# Patient Record
Sex: Male | Born: 1990 | Race: Black or African American | Hispanic: No | Marital: Single | Smoking: Current every day smoker
Health system: Southern US, Community
[De-identification: ages and names within clinical notes are randomized; demographics above are authoritative.]

## PROBLEM LIST (undated history)

## (undated) ENCOUNTER — Emergency Department: Disposition: A | Discharge status: Home / Self Care | Payer: Self-pay

## (undated) DIAGNOSIS — D849 Immunodeficiency, unspecified: Secondary | ICD-10-CM

## (undated) DIAGNOSIS — A6920 Lyme disease, unspecified: Secondary | ICD-10-CM

---

## 2013-07-26 ENCOUNTER — Emergency Department: Payer: Self-pay | Admitting: Emergency Medicine

## 2014-09-07 ENCOUNTER — Emergency Department: Payer: Self-pay | Admitting: Student

## 2014-09-12 ENCOUNTER — Emergency Department (HOSPITAL_COMMUNITY): Payer: Self-pay

## 2014-09-12 ENCOUNTER — Emergency Department (HOSPITAL_COMMUNITY)
Admission: EM | Admit: 2014-09-12 | Discharge: 2014-09-12 | Disposition: A | Discharge status: Home / Self Care | Payer: Self-pay | Attending: Emergency Medicine | Admitting: Emergency Medicine

## 2014-09-12 ENCOUNTER — Encounter (HOSPITAL_COMMUNITY): Payer: Self-pay | Admitting: Emergency Medicine

## 2014-09-12 DIAGNOSIS — M25512 Pain in left shoulder: Secondary | ICD-10-CM

## 2014-09-12 DIAGNOSIS — Y9269 Other specified industrial and construction area as the place of occurrence of the external cause: Secondary | ICD-10-CM | POA: Insufficient documentation

## 2014-09-12 DIAGNOSIS — W109XXA Fall (on) (from) unspecified stairs and steps, initial encounter: Secondary | ICD-10-CM | POA: Insufficient documentation

## 2014-09-12 DIAGNOSIS — Y990 Civilian activity done for income or pay: Secondary | ICD-10-CM | POA: Insufficient documentation

## 2014-09-12 DIAGNOSIS — Y9389 Activity, other specified: Secondary | ICD-10-CM | POA: Insufficient documentation

## 2014-09-12 DIAGNOSIS — Z720 Tobacco use: Secondary | ICD-10-CM | POA: Insufficient documentation

## 2014-09-12 DIAGNOSIS — S4992XA Unspecified injury of left shoulder and upper arm, initial encounter: Secondary | ICD-10-CM | POA: Insufficient documentation

## 2014-09-12 MED ORDER — OXYCODONE-ACETAMINOPHEN 5-325 MG PO TABS: 1.0000 | ORAL_TABLET | Freq: Four times a day (QID) | ORAL | Status: DC | PRN

## 2014-09-12 MED ORDER — ONDANSETRON 4 MG PO TBDP
4.0000 mg | ORAL_TABLET | Freq: Once | ORAL | Status: AC
  Administered 2014-09-12: 4 mg via ORAL
  Filled 2014-09-12: qty 1

## 2014-09-12 MED ORDER — OXYCODONE-ACETAMINOPHEN 5-325 MG PO TABS
1.0000 | ORAL_TABLET | Freq: Once | ORAL | Status: AC
  Administered 2014-09-12: 1 via ORAL
  Filled 2014-09-12: qty 1

## 2014-09-12 NOTE — Progress Notes (Signed)
  CARE MANAGEMENT ED NOTE 09/12/2014  Patient:  Hilliard ClarkWHITTED,Adilson E   Account Number:  [id number]  Date Initiated:  09/12/2014  Documentation initiated by:  Edd ArbourGIBBS,KIMBERLY  Subjective/Objective Assessment:   23 yr old self pay [place] pt L shoulder injury Friday. 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 Continues to have pain. Decreased ROM. CMS intact distally.     Subjective/Objective Assessment Detail:   Pt [place] [address] [place], no pcp and no insurance coverage     Action/Plan:   ED Cm spoke with pt Cm provided resources see note below   Action/Plan Detail:   Anticipated DC Date:  09/12/2014     Status Recommendation to Physician:   Result of Recommendation:    Other ED Services  Consult Working Plan    DC Planning Services  Other  Outpatient Services - Pt will follow up  PCP issues    Choice offered to / List presented to:            Status of service:  Completed, signed off  ED Comments:   ED Comments Detail:  CM discussed and provided written information for [place] self pay pcps, importance of pcp for f/u care, www.needymeds.org, discounted pharmacies and other resources such as affordable care act,  Pt voiced understanding and appreciation of resources provided  Phineas Realharles Drew [facility] Location: [place], [place]- [id number] Contact Phone: [phone] Services: Dental Care Services, Enabling Services, Obstetrical and Gynecological Care, Primary Medical Care Remarks: Year round Phineas Realharles Drew [address] Location: [place], [place] -- [phone] Contact Phone: [phone] Services: Child, Teen, Adult and Insurance claims handlerenior Healthcare, Pitney BowesWomen's Health, Wellness, Prenatal Care and [employer]Family Planning, Physical Exams (school, employment, sports, nursing home, etc.), Chronic Illness, Minor Trauma, Immunizations, Flu Shots, Stage managerLaboratory Services On-Site, [place] Remarks: Community Health, Urban Area, [facility], Year-Round,  Full-Time (open 52 hours per week)  [facility] Location: [place], [place]- [id number] Contact Phone: [phone] Services: Medical Services Remarks: Hours Of Operation: Tuesday: 4:15 PM to 8:00 PM, Thursday: 4:15 PM to 8:00 PM; Eye Clinic Hours: Wednesday 9:00AM - 12:00PM, Thursday 1:00PM - 5:00PM - See more at: http://freeclinicdirectory.org/[place].html#[email or web address]  [facility] Location: [place], [place]- [id number] Contact Phone: [phone] Services: Child, Teen, Adult and American [employer] PowerSenior Healthcare; Immunizations; Physical Exams (school, employment, sports, nursing home, etc.); Well Woman exams; Chronic Illness; Minor Illness; Minor Trauma; Flu Shots; The [employer] CompaniesLaboratory Services; Care Management Remarks: If you are uninsured, payment for services is based on a sliding-fee scale according to family income. Hours: Monday/Tuesday/Wednesday/Thursday/Friday 8:00am - 1:30pm; Evening Hours Only: Monday 3:30pm - 7:30pm, Thursday 3:30pm - 7:30pm [address] Location: [place], [place] -- [phone] Contact Phone: [phone] Services: Child, Teen, Adult and American Electric PowerSenior Healthcare, Pitney BowesWomen's Health, Wellness, Prenatal Care and [employer]Family Planning, Physical Exams (school, employment, sports, nursing home, etc.), Chronic Illness, Minor Trauma, Immunizations, Flu Shots, Stage managerLaboratory Services On-Site, [place] Remarks: [facility], Rural Area, [facility], [place]Full-Time (open 45 hours per week) [facility] Location: [place], [place]- [phone] Contact Phone: [phone] Services: Child, Teen, Adult and Insurance claims handlerenior Healthcare, Pitney BowesWomen's Health, Wellness, Prenatal Care and [employer], IncFamily Planning, Physical Exams (school, employment, sports, nursing home, etc.), Chronic Illness, Minor Trauma, Immunizations, Flu Shots, United Technologies CorporationLaboratory Services On-Site, [place] Remarks: Year round, Rural Area, [facility], Year-Round, Full-Time (open 45 hours per week) - See more at:  http://[email or web address].org/[email or web address]_care/[place].html#[email or web address]

## 2014-09-12 NOTE — ED Notes (Signed)
Harrison, MD at bedside. 

## 2014-09-12 NOTE — ED Provider Notes (Signed)
CSN: [id number]     Arrival date & time 09/12/14  1517 History   First MD Initiated Contact with Patient 09/12/14 1542     Chief Complaint  Patient presents with  . Shoulder Pain     (Consider location/radiation/quality/duration/timing/severity/associated sxs/prior Treatment) Patient is a 23 y.o. male presenting with shoulder pain. The history is provided by the patient.  Shoulder Pain This is a new problem. Episode onset: 5 days ago. The problem occurs constantly. The problem has not changed since onset.Pertinent negatives include no chest pain, no abdominal pain, no headaches and no shortness of breath. Exacerbated by: moving the left shoulder. Nothing relieves the symptoms. He has tried rest for the symptoms. The treatment provided no relief.    History reviewed. No pertinent past medical history. History reviewed. No pertinent past surgical history. No family history on file. History  Substance Use Topics  . Smoking status: Current Every Day Smoker  . Smokeless tobacco: Not on file  . Alcohol Use: No    Review of Systems  Constitutional: Negative for fever.  HENT: Negative for drooling and rhinorrhea.   Eyes: Negative for pain.  Respiratory: Negative for cough and shortness of breath.   Cardiovascular: Negative for chest pain and leg swelling.  Gastrointestinal: Negative for nausea, vomiting, abdominal pain and diarrhea.  Genitourinary: Negative for dysuria and hematuria.  Musculoskeletal: Negative for gait problem and neck pain.  Skin: Negative for color change.  Neurological: Negative for numbness and headaches.  Hematological: Negative for adenopathy.  Psychiatric/Behavioral: Negative for behavioral problems.  All other systems reviewed and are negative.     Allergies  Review of patient's allergies indicates no known allergies.  Home Medications   Prior to Admission medications   Not on File   BP 126/81  Pulse 65  Temp(Src) 98.2 F (36.8 C) (Oral)  Resp  14  SpO2 100% Physical Exam  Nursing note and vitals reviewed. Constitutional: He is oriented to person, place, and time. He appears well-developed and well-nourished.  HENT:  Head: Normocephalic and atraumatic.  Right Ear: External ear normal.  Left Ear: External ear normal.  Nose: Nose normal.  Mouth/Throat: Oropharynx is clear and moist. No oropharyngeal exudate.  Eyes: Conjunctivae and EOM are normal. Pupils are equal, round, and reactive to light.  Neck: Normal range of motion. Neck supple.  No vertebral tenderness to palpation noted.  Cardiovascular: Normal rate, regular rhythm, normal heart sounds and intact distal pulses.  Exam reveals no gallop and no friction rub.   No murmur heard. Pulmonary/Chest: Effort normal and breath sounds normal. No respiratory distress. He has no wheezes.  Abdominal: Soft. Bowel sounds are normal. He exhibits no distension. There is no tenderness. There is no rebound and no guarding.  Musculoskeletal: He exhibits tenderness. He exhibits no edema.  Significantly decreased range of motion of the left shoulder due to pain. The patient appears to have normal strength in the left elbow and left wrist. Normal motor skills of the left hand. Normal sensation in the left upper terminate diffusely.  2+ distal and proximal pulses in the left upper extremity.  Diffuse tenderness to palpation of the left clavicle and left shoulder.  Neurological: He is alert and oriented to person, place, and time.  Skin: Skin is warm and dry.  Psychiatric: He has a normal mood and affect. His behavior is normal.    ED Course  Procedures (including critical care time) Labs Review Labs Reviewed - No data to display  Imaging Review Dg Clavicle  Left  09/12/2014   CLINICAL DATA:  Subsequent encounter for Fall down stairs 5 days ago landing on left shoulder. Severe pain.  EXAM: LEFT CLAVICLE - 2+ VIEWS  COMPARISON:  09/07/2014  FINDINGS: Two view exam the left clavicle again  shows the fracture involving the distal aspect of the left clavicle. The associated scapular fracture is re- demonstrated. Acromioclavicular and coracoclavicular distances are preserved despite the presence of the fractures.  IMPRESSION: Stable appearance of distal left clavicular and superior scapular fractures.   Electronically Signed   By: Kennith Center M.D.   On: 09/12/2014 16:13   Dg Shoulder Left  09/12/2014   CLINICAL DATA:  Fall down stairs on Friday. Severe pain to anterior shoulder and clavicle.  EXAM: LEFT SHOULDER - 2+ VIEW  COMPARISON:  None.  FINDINGS: Again identified is a mildly displaced, comminuted fracture involving the distal left clavicle. Nondisplaced scapular fracture is unchanged from previous exam. The glenohumeral joint appears intact. No humerus fracture.  IMPRESSION: 1. Stable appearance of distal clavicle and scapular fractures.   Electronically Signed   By: Signa Kell M.D.   On: 09/12/2014 16:09     EKG Interpretation None      MDM   Final diagnoses:  Left shoulder pain    4:11 PM 23 y.o. 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 He denies hitting his head or loss of consciousness. He has no vertebral tenderness to palpation. He has diffuse left shoulder pain which does not localize. Vital signs are unremarkable here. Will get pain control and imaging.  The patient notified me that he had a distal clavicle and fracture which was first identified after the fall at [facility]. His fractures appear unchanged and stable on today's imaging. Will place him in a sling for comfort and warned him about frozen shoulder. He has not had a PCP and he was given resources to followup in [place] where he lives. Will prescribe him more pain medicine.  5:11 PM:  I have discussed the diagnosis/risks/treatment options with the patient and  believe the pt to be eligible for discharge home to follow-up with a pcp in [place], use the resources provided. We also discussed returning to the ED immediately if new or worsening sx occur. We discussed the sx which are most concerning (e.g., worsening pain) that necessitate immediate return. Medications administered to the patient during their visit and any new prescriptions provided to the patient are listed below.  Medications given during this visit Medications  oxyCODONE-acetaminophen (PERCOCET/ROXICET) 5-325 MG per tablet 1 tablet (1 tablet Oral Given 09/12/14 1602)  ondansetron (ZOFRAN-ODT) disintegrating tablet 4 mg (4 mg Oral Given 09/12/14 1602)    New Prescriptions   OXYCODONE-ACETAMINOPHEN (PERCOCET) 5-325 MG PER TABLET    Take 1-2 tablets by mouth every 6 (six) hours as needed for moderate pain.     Purvis Sheffield, MD 09/12/14 1715

## 2014-09-12 NOTE — ED Notes (Signed)
Pt reports L shoulder injury Friday. 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 Continues to have pain. Decreased ROM. CMS intact distally.

## 2014-09-24 ENCOUNTER — Encounter (HOSPITAL_COMMUNITY): Payer: Self-pay | Admitting: Emergency Medicine

## 2014-09-24 ENCOUNTER — Emergency Department (HOSPITAL_COMMUNITY)
Admission: EM | Admit: 2014-09-24 | Discharge: 2014-09-24 | Disposition: A | Discharge status: Home / Self Care | Payer: Self-pay | Attending: Emergency Medicine | Admitting: Emergency Medicine

## 2014-09-24 ENCOUNTER — Emergency Department (HOSPITAL_COMMUNITY): Payer: Self-pay

## 2014-09-24 DIAGNOSIS — X58XXXA Exposure to other specified factors, initial encounter: Secondary | ICD-10-CM | POA: Insufficient documentation

## 2014-09-24 DIAGNOSIS — Z720 Tobacco use: Secondary | ICD-10-CM | POA: Insufficient documentation

## 2014-09-24 DIAGNOSIS — S42002D Fracture of unspecified part of left clavicle, subsequent encounter for fracture with routine healing: Secondary | ICD-10-CM

## 2014-09-24 DIAGNOSIS — S42192D Fracture of other part of scapula, left shoulder, subsequent encounter for fracture with routine healing: Secondary | ICD-10-CM | POA: Insufficient documentation

## 2014-09-24 DIAGNOSIS — S42102D Fracture of unspecified part of scapula, left shoulder, subsequent encounter for fracture with routine healing: Secondary | ICD-10-CM

## 2014-09-24 DIAGNOSIS — S42022D Displaced fracture of shaft of left clavicle, subsequent encounter for fracture with routine healing: Secondary | ICD-10-CM | POA: Insufficient documentation

## 2014-09-24 MED ORDER — HYDROCODONE-ACETAMINOPHEN 5-325 MG PO TABS
1.0000 | ORAL_TABLET | Freq: Four times a day (QID) | ORAL | Status: DC | PRN
Start: 2014-09-24 — End: 2015-09-02

## 2014-09-24 MED ORDER — NAPROXEN 500 MG PO TABS: 500.0000 mg | ORAL_TABLET | Freq: Two times a day (BID) | ORAL | Status: DC

## 2014-09-24 MED ORDER — OXYCODONE-ACETAMINOPHEN 5-325 MG PO TABS
1.0000 | ORAL_TABLET | Freq: Once | ORAL | Status: AC
Start: 2014-09-24 — End: 2014-09-24
  Administered 2014-09-24: 1 via ORAL
  Filled 2014-09-24: qty 1

## 2014-09-24 NOTE — Discharge Instructions (Signed)
Continue pain medications, ice. Please try to follow up with orthopedics or primary care doctor to make sure you are healing well and start physical therapy.   Clavicle Fracture The clavicle, also called the collarbone, is the long bone that connects your shoulder to your rib cage. You can feel your collarbone at the top of your shoulders and rib cage. A clavicle fracture is a broken clavicle. It is a common injury that can happen at any age.  CAUSES Common causes of a clavicle fracture include:  A direct blow to your shoulder.  A car accident.  A fall, especially if you try to break your fall with an outstretched arm. RISK FACTORS You may be at increased risk if:  You are younger than 25 years or older than 75 years. Most clavicle fractures happen to people who are younger than 25 years.  You are a male.  You play contact sports. SIGNS AND SYMPTOMS A fractured clavicle is painful. It also makes it hard to move your arm. Other signs and symptoms may include:  A shoulder that drops downward and forward.  Pain when trying to lift your shoulder.  Bruising, swelling, and tenderness over your clavicle.  A grinding noise when you try to move your shoulder.  A bump over your clavicle. DIAGNOSIS Your health care provider can usually diagnose a clavicle fracture by asking about your injury and examining your shoulder and clavicle. He or she may take an X-ray to determine the position of your clavicle. TREATMENT Treatment depends on the position of your clavicle after the fracture:  If the broken ends of the bone are not out of place, your health care provider may put your arm in a sling or wrap a support bandage around your chest (figure-of-eight wrap).  If the broken ends of the bone are out of place, you may need surgery. Surgery may involve placing screws, pins, or plates to keep your clavicle stable while it heals. Healing may take about 3 months. When your health care provider  thinks your fracture has healed enough, you may have to do physical therapy to regain normal movement and build up your arm strength. HOME CARE INSTRUCTIONS   Apply ice to the injured area:  Put ice in a plastic bag.  Place a towel between your skin and the bag.  Leave the ice on for 20 minutes, 2-3 times a day.  If you have a wrap or splint:  Wear it all the time, and remove it only to take a bath or shower.  When you bathe or shower, keep your shoulder in the same position as when the sling or wrap is on.  Do not lift your arm.  If you have a figure-of-eight wrap:  Another person must tighten it every day.  It should be tight enough to hold your shoulders back.  Allow enough room to place your index finger between your body and the strap.  Loosen the wrap immediately if you feel numbness or tingling in your hands.  Only take medicines as directed by your health care provider.  Avoid activities that make the injury or pain worse for 4-6 weeks after surgery.  Keep all follow-up appointments. SEEK MEDICAL CARE IF:  Your medicine is not helping to relieve pain and swelling. SEEK IMMEDIATE MEDICAL CARE IF:  Your arm is numb, cold, or pale, even when the splint is loose. MAKE SURE YOU:   Understand these instructions.  Will watch your condition.  Will get help right  away if you are not doing well or get worse. Document Released: 09/02/2005 Document Revised: 11/28/2013 Document Reviewed: 10/16/2013 [facility] Patient Information 2015 [place], [place]. This information is not intended to replace advice given to you by your health care provider. Make sure you discuss any questions you have with your health care provider.

## 2014-09-24 NOTE — ED Notes (Signed)
Pt reports he injured his left shoulder, was seen and treated in ED on 10/7, was given brace to wear and pain medication.pt reports he ran out of pain medication yesterday. Pain 8/10.

## 2014-09-24 NOTE — ED Notes (Signed)
Pt to xray

## 2014-09-24 NOTE — ED Provider Notes (Signed)
CSN: [id number]     Arrival date & time 09/24/14  1225 History  This chart was scribed for non-physician practitioner, Jaynie Crumbleatyana Victormanuel Mclure, PA-C working with Ethelda ChickMartha K Linker, MD by Greggory StallionKayla Andersen, ED scribe. This patient was seen in room WTR8/WTR8 and the patient's care was started at 1:08 PM.   Chief Complaint  Patient presents with  . Shoulder Pain   The history is provided by the patient. No language interpreter was used.   HPI Comments: Zachary Williamson is a 23 y.o. male who presents to the Emergency Department complaining of continued left shoulder pain after injury on 09/07/14. Pt was evaluated at [place] the day of the injury and states a collarbone fracture was found. He was evaluated here 5 days after initial injury and given a sling and percocet. He was also told to follow up with orthopedics but states he can not afford it since he doesn't have insurance. States he ran out of the percocet yesterday so he has taken tylenol and advil with no relief.   History reviewed. No pertinent past medical history. History reviewed. No pertinent past surgical history. History reviewed. No pertinent family history. History  Substance Use Topics  . Smoking status: Current Every Day Smoker  . Smokeless tobacco: Not on file  . Alcohol Use: No    Review of Systems  Musculoskeletal: Positive for arthralgias.  All other systems reviewed and are negative.  Allergies  Review of patient's allergies indicates no known allergies.  Home Medications   Prior to Admission medications   Medication Sig Start Date End Date Taking? Authorizing Provider  oxyCODONE-acetaminophen (PERCOCET) 5-325 MG per tablet Take 1-2 tablets by mouth every 6 (six) hours as needed for moderate pain. 09/12/14   Purvis SheffieldForrest Harrison, MD   BP 129/76  Pulse 103  Temp(Src) 98.2 F (36.8 C) (Oral)  Resp 16  SpO2 100%  Physical Exam  Nursing note and vitals reviewed. Constitutional: He is oriented to person, place, and time.  He appears well-developed and well-nourished. No distress.  HENT:  Head: Normocephalic and atraumatic.  Eyes: Conjunctivae and EOM are normal.  Neck: Neck supple. No tracheal deviation present.  Cardiovascular: Normal rate.   Pulmonary/Chest: Effort normal. No respiratory distress.  Musculoskeletal: Normal range of motion.  Tenderness over left clavicle and left scapula. There is no bruising, swelling, deformity over either one of them. Pain with any range of motion at the left shoulder. Unable to raise his arm past 90. Distal radial pulses intact. Normal elbow.  Neurological: He is alert and oriented to person, place, and time.  Skin: Skin is warm and dry.  Psychiatric: He has a normal mood and affect. His behavior is normal.    ED Course  Procedures (including critical care time)  DIAGNOSTIC STUDIES: Oxygen Saturation is 100% on RA, normal by my interpretation.    COORDINATION OF CARE: 1:10 PM-Discussed treatment plan which includes repeat xray with pt at bedside and pt agreed to plan.   Labs Review Labs Reviewed - No data to display  Imaging Review Dg Clavicle Left  09/24/2014   CLINICAL DATA:  Shoulder pain. Clavicle and scapular fractures. The pain has progressed. Subsequent encounter.  EXAM: LEFT CLAVICLE - 2+ VIEWS  COMPARISON:  Left shoulder in clavicle radiographs 09/12/2014.  FINDINGS: Continued callus formation is noted about the distal clavicle fracture. Scapular fractures are unchanged compared to the prior study. There is no new clavicle fracture. The visualized left hemithorax is clear.  IMPRESSION: 1. Progressive callus formation about  a distal left clavicle fracture. 2. Stable appearance of the left scapular fracture.   Electronically Signed   By: Gennette Pachris  Mattern M.D.   On: 09/24/2014 13:52   Dg Shoulder Left  09/24/2014   CLINICAL DATA:  Mildly displaced, comminuted, closed and posttraumatic fracture of distal left clavicle, subsequent for routine healing.  EXAM:  LEFT SHOULDER - 2+ VIEW  COMPARISON:  September 12, 2014.  FINDINGS: There remains a mildly displaced fracture involving the distal portion of the left clavicle. It appears to be comminuted. No definite callus formation is seen at this time. Glenohumeral joint appears normal. Ribs appear normal. No dislocation is noted.  IMPRESSION: Mildly displaced distal left clavicular fracture which is not significantly changed compared to prior exam.   Electronically Signed   By: Roque LiasJames  Green M.D.   On: 09/24/2014 13:52     EKG Interpretation None      MDM   Final diagnoses:  Clavicle fracture, left, with routine healing, subsequent encounter  Scapula fracture, left, with routine healing, subsequent encounter    Patient with known scapula and clavicle fracture after falling 2-1/2 weeks ago. [facility] and again here 2 weeks ago. He has not followed up with orthopedics. He is wearing a immobilizer brace. States pain is not improving. States he may have worsening pain in his clavicle. Images repeated today to make sure there is no worsening in his fracture. Patient emphasized importance of following up with orthopedic specialist to make sure he is healing appropriately. Patient voiced understanding. Will treat with Norco, naproxen. Followup.  Filed Vitals:   09/24/14 1241  BP: 129/76  Pulse: 103  Temp: 98.2 F (36.8 C)  TempSrc: Oral  Resp: 16  SpO2: 100%     I personally performed the services described in this documentation, which was scribed in my presence. The recorded information has been reviewed and is accurate.  Lottie Musselatyana A Karlee Staff, PA-C 09/24/14 1856

## 2014-09-24 NOTE — ED Notes (Signed)
Pt escorted to discharge window. Pt verbalized understanding discharge instructions. In no acute distress.  

## 2014-09-24 NOTE — ED Provider Notes (Signed)
Medical screening examination/treatment/procedure(s) were performed by non-physician practitioner and as supervising physician I was immediately available for consultation/collaboration.   EKG Interpretation None       Toy BakerAnthony T Mykah Bellomo, MD 09/24/14 2348

## 2015-07-15 IMAGING — CR DG SHOULDER 2+V*L*
2 series · 2 of 2 positions shown · non-contrast
Comparison: September 12, 2014.

CLINICAL DATA: Mildly displaced, comminuted, closed and
posttraumatic fracture of distal left clavicle, subsequent for
routine healing.

EXAM:
LEFT SHOULDER - 2+ VIEW

[w shoulder external left]
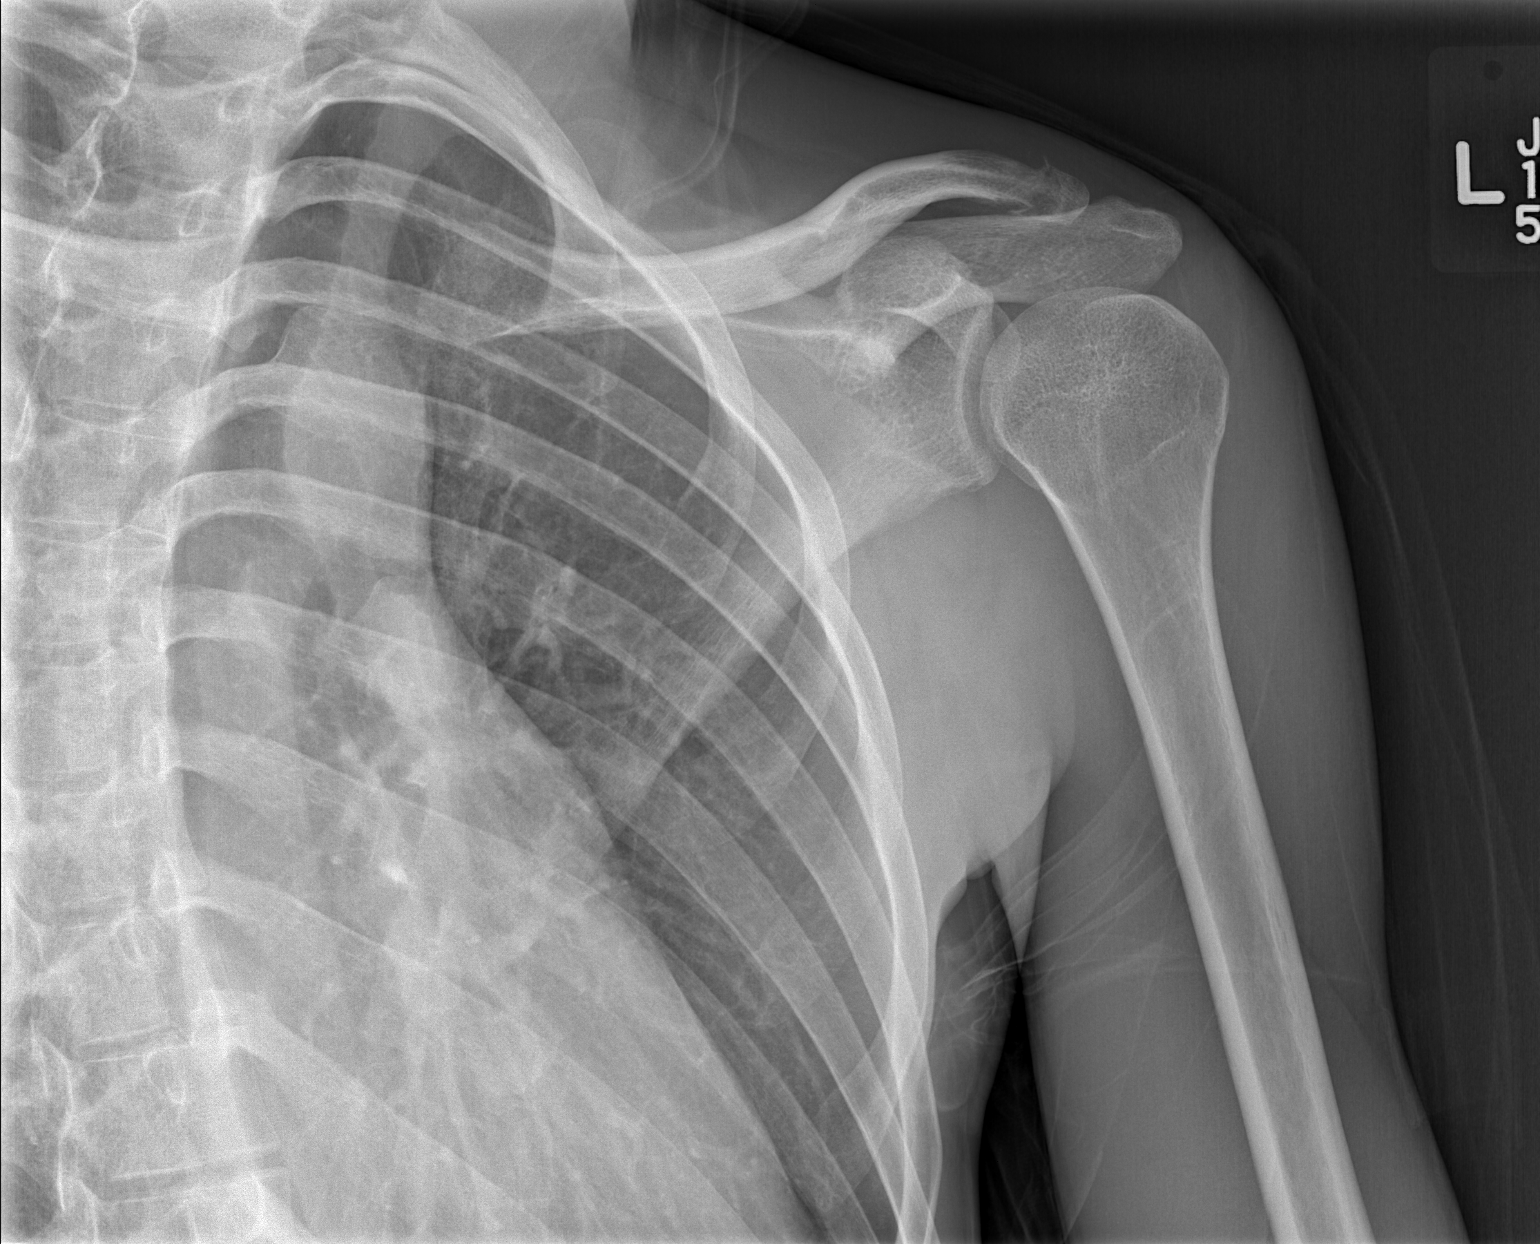

[w shoulder y-view left]
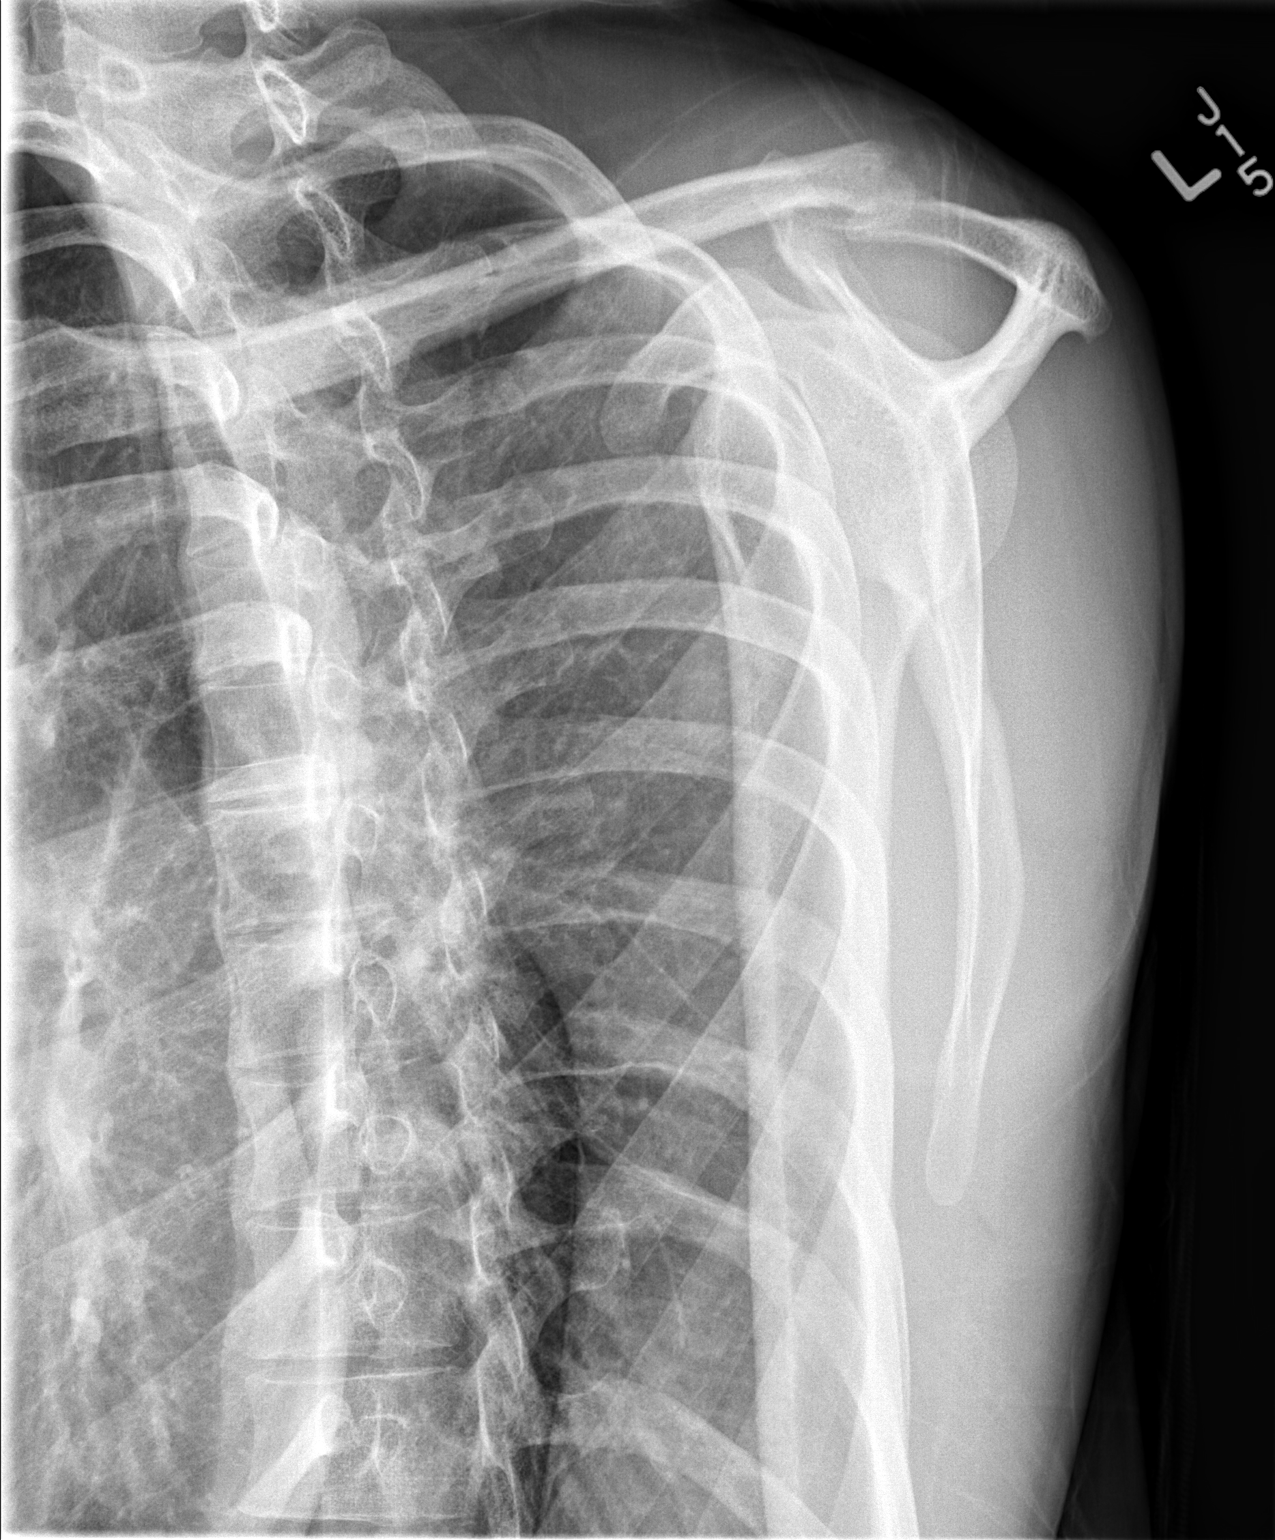

[2 of 2 positions shown; findings below may reference images not displayed]

FINDINGS: There remains a mildly displaced fracture involving the distal
portion of the left clavicle. It appears to be comminuted. No
definite callus formation is seen at this time. Glenohumeral joint
appears normal. Ribs appear normal. No dislocation is noted.
IMPRESSION: Mildly displaced distal left clavicular fracture which is not
significantly changed compared to prior exam.

## 2015-09-02 ENCOUNTER — Encounter: Payer: Self-pay | Admitting: Emergency Medicine

## 2015-09-02 ENCOUNTER — Emergency Department: Payer: Self-pay

## 2015-09-02 ENCOUNTER — Emergency Department
Admission: EM | Admit: 2015-09-02 | Discharge: 2015-09-02 | Disposition: A | Discharge status: Home / Self Care | Payer: Self-pay | Attending: Emergency Medicine | Admitting: Emergency Medicine

## 2015-09-02 DIAGNOSIS — Z720 Tobacco use: Secondary | ICD-10-CM | POA: Insufficient documentation

## 2015-09-02 DIAGNOSIS — S8002XA Contusion of left knee, initial encounter: Secondary | ICD-10-CM | POA: Insufficient documentation

## 2015-09-02 DIAGNOSIS — Y998 Other external cause status: Secondary | ICD-10-CM | POA: Insufficient documentation

## 2015-09-02 DIAGNOSIS — W11XXXA Fall on and from ladder, initial encounter: Secondary | ICD-10-CM | POA: Insufficient documentation

## 2015-09-02 DIAGNOSIS — Y9339 Activity, other involving climbing, rappelling and jumping off: Secondary | ICD-10-CM | POA: Insufficient documentation

## 2015-09-02 DIAGNOSIS — S40011A Contusion of right shoulder, initial encounter: Secondary | ICD-10-CM | POA: Insufficient documentation

## 2015-09-02 DIAGNOSIS — Y92008 Other place in unspecified non-institutional (private) residence as the place of occurrence of the external cause: Secondary | ICD-10-CM | POA: Insufficient documentation

## 2015-09-02 MED ORDER — METHOCARBAMOL 500 MG PO TABS: 500.0000 mg | ORAL_TABLET | Freq: Four times a day (QID) | ORAL | Status: DC | PRN

## 2015-09-02 MED ORDER — NAPROXEN 500 MG PO TBEC: 500.0000 mg | DELAYED_RELEASE_TABLET | Freq: Two times a day (BID) | ORAL | Status: DC

## 2015-09-02 NOTE — Discharge Instructions (Signed)
Contusion °A contusion is a deep bruise. Contusions happen when an injury causes bleeding under the skin. Signs of bruising include pain, puffiness (swelling), and discolored skin. The contusion may turn blue, purple, or yellow. °HOME CARE  °· Put ice on the injured area. °¨ Put ice in a plastic bag. °¨ Place a towel between your skin and the bag. °¨ Leave the ice on for 15-20 minutes, 03-04 times a day. °· Only take medicine as told by your doctor. °· Rest the injured area. °· If possible, raise (elevate) the injured area to lessen puffiness. °GET HELP RIGHT AWAY IF:  °· You have more bruising or puffiness. °· You have pain that is getting worse. °· Your puffiness or pain is not helped by medicine. °MAKE SURE YOU:  °· Understand these instructions. °· Will watch your condition. °· Will get help right away if you are not doing well or get worse. °Document Released: 05/11/2008 Document Revised: 02/15/2012 Document Reviewed: 09/28/2011 °ExitCare® Patient Information ©2015 [employer]. This information is not intended to replace advice given to you by your health care provider. Make sure you discuss any questions you have with your health care provider. ° °

## 2015-09-02 NOTE — ED Notes (Signed)
States he going up a ladder on Saturday and the ladder slipped   He fell ..landed on grass  Having pain to left knee .upper back and headache

## 2015-09-02 NOTE — ED Provider Notes (Signed)
[facility] Emergency Department Provider Note  ____________________________________________  Time seen: Approximately 8:59 AM  I have reviewed the triage vital signs and the nursing notes.   HISTORY  Chief Complaint Fall    HPI Zachary Williamson is a 24 y.o. male who presents for evaluation of right shoulder pain and left knee pain. Patient states that he was climbing up a ladder to help his grandmother pain in her house when the ladder went sideways and he jumped off. Incident occurred 2 days prior to arrival. He shouldn't denies any loss of consciousness no numbness no tingling. Pain does not radiate either from his shoulder or his knee. Pain is centrally located to the right shoulder blade and lateral on the left knee.  History reviewed. No pertinent past medical history.  There are no active problems to display for this patient.   History reviewed. No pertinent past surgical history.  Current Outpatient Rx  Name  Route  Sig  Dispense  Refill  . methocarbamol (ROBAXIN) 500 MG tablet   Oral   Take 1 tablet (500 mg total) by mouth every 6 (six) hours as needed for muscle spasms.   30 tablet   0   . naproxen (EC NAPROSYN) 500 MG EC tablet   Oral   Take 1 tablet (500 mg total) by mouth 2 (two) times daily with a meal.   60 tablet   0     Allergies Review of patient's allergies indicates no known allergies.  No family history on file.  Social History Social History  Substance Use Topics  . Smoking status: Current Every Day Smoker  . Smokeless tobacco: None  . Alcohol Use: No    Review of Systems Constitutional: No fever/chills Eyes: No visual changes. ENT: No sore throat. Cardiovascular: Denies chest pain. Respiratory: Denies shortness of breath. Gastrointestinal: No abdominal pain.  No nausea, no vomiting.  No diarrhea.  No constipation. Genitourinary: Negative for dysuria. Musculoskeletal: Positive for right shoulder and left knee  pain. Skin: Negative for rash. Neurological: Negative for headaches, focal weakness or numbness.  10-point ROS otherwise negative.  ____________________________________________   PHYSICAL EXAM:  VITAL SIGNS: ED Triage Vitals  Enc Vitals Group     BP --      Pulse --      Resp --      Temp --      Temp src --      SpO2 --      Weight --      Height --      Head Cir --      Peak Flow --      Pain Score --      Pain Loc --      Pain Edu? --      Excl. in GC? --     Constitutional: Alert and oriented. Well appearing and in no acute distress. Eyes: Conjunctivae are normal. PERRL. EOMI. Head: Atraumatic. Nose: No congestion/rhinnorhea. Mouth/Throat: Mucous membranes are moist.  Oropharynx non-erythematous. Neck: No stridor.  Cervical spine is nontender with full range of motion. Cardiovascular: Normal rate, regular rhythm. Grossly normal heart sounds.  Good peripheral circulation. Respiratory: Normal respiratory effort.  No retractions. Lungs CTAB. Gastrointestinal: Soft and nontender. No distention. No abdominal bruits. No CVA tenderness. Musculoskeletal: No joint effusion noted. Right shoulder with full range of motion increased pain and muscular layer around the scapula. Left knee with full range of motion bony in appearance no evidence of effusion positive strength.  No ACL, LCL tenderness Neurologic:  Normal speech and language. No gross focal neurologic deficits are appreciated. No gait instability. Skin:  Skin is warm, dry and intact. No rash noted. Psychiatric: Mood and affect are normal. Speech and behavior are normal.  ____________________________________________   LABS (all labs ordered are listed, but only abnormal results are displayed)  Labs Reviewed - No data to display ____________________________________________   RADIOLOGY  Right shoulder x-ray is unremarkable. Left knee x-rays are unremarkable. Exam by radiologist and reviewed by  myself. ____________________________________________   PROCEDURES  Procedure(s) performed: None  Critical Care performed: No  ____________________________________________   INITIAL IMPRESSION / ASSESSMENT AND PLAN / ED COURSE  Pertinent labs & imaging results that were available during my care of the patient were reviewed by me and considered in my medical decision making (see chart for details).  Status post fall from ladder right shoulder and left knee contusions. Rx given for Robaxin 500 mg 4 times a day, naproxen 550 twice a day. Work excuse 1 day patient follow-up with PCP or return to the ER with any worsening symptomology. ____________________________________________   FINAL CLINICAL IMPRESSION(S) / ED DIAGNOSES  Final diagnoses:  Fall from ladder, initial encounter  Shoulder contusion, right, initial encounter  Knee contusion, left, initial encounter      Evangeline Dakin, PA-C 09/02/15 [id number]  Sharyn Creamer, MD 09/02/15 [phone]

## 2015-09-09 ENCOUNTER — Emergency Department
Admission: EM | Admit: 2015-09-09 | Discharge: 2015-09-09 | Disposition: A | Discharge status: Home / Self Care | Payer: Self-pay | Attending: Emergency Medicine | Admitting: Emergency Medicine

## 2015-09-09 ENCOUNTER — Encounter: Payer: Self-pay | Admitting: Emergency Medicine

## 2015-09-09 DIAGNOSIS — Y92009 Unspecified place in unspecified non-institutional (private) residence as the place of occurrence of the external cause: Secondary | ICD-10-CM | POA: Insufficient documentation

## 2015-09-09 DIAGNOSIS — S39012A Strain of muscle, fascia and tendon of lower back, initial encounter: Secondary | ICD-10-CM | POA: Insufficient documentation

## 2015-09-09 DIAGNOSIS — Y998 Other external cause status: Secondary | ICD-10-CM | POA: Insufficient documentation

## 2015-09-09 DIAGNOSIS — Z720 Tobacco use: Secondary | ICD-10-CM | POA: Insufficient documentation

## 2015-09-09 DIAGNOSIS — Y9389 Activity, other specified: Secondary | ICD-10-CM | POA: Insufficient documentation

## 2015-09-09 DIAGNOSIS — G44219 Episodic tension-type headache, not intractable: Secondary | ICD-10-CM | POA: Insufficient documentation

## 2015-09-09 DIAGNOSIS — W11XXXA Fall on and from ladder, initial encounter: Secondary | ICD-10-CM | POA: Insufficient documentation

## 2015-09-09 MED ORDER — KETOROLAC TROMETHAMINE 10 MG PO TABS: 10.0000 mg | ORAL_TABLET | Freq: Four times a day (QID) | ORAL | Status: DC | PRN

## 2015-09-09 MED ORDER — CYCLOBENZAPRINE HCL 10 MG PO TABS: 10.0000 mg | ORAL_TABLET | Freq: Three times a day (TID) | ORAL | Status: DC | PRN

## 2015-09-09 MED ORDER — KETOROLAC TROMETHAMINE 60 MG/2ML IM SOLN
60.0000 mg | Freq: Once | INTRAMUSCULAR | Status: AC
  Administered 2015-09-09: 60 mg via INTRAMUSCULAR
  Filled 2015-09-09: qty 2

## 2015-09-09 NOTE — ED Notes (Signed)
States he fell from ladder on 9/26 and landed on the grass ..still having pain to lower back and headache

## 2015-09-09 NOTE — ED Provider Notes (Signed)
[facility] Emergency Department Provider Note ____________________________________________  Time seen: Approximately 8:43 AM  I have reviewed the triage vital signs and the nursing notes.   HISTORY  Chief Complaint Back Pain   HPI Zachary Williamson is a 24 y.o. male who presents to the emergency department for evaluation of right lower back pain and headache. He fell off a ladder on 09/02/2015 while helping paint his grandmother's house. He was evaluated here for shoulder pain and knee pain at that time. That pain has resolved, but he now has pain in his right lower back and believes it is causing him to have a headache. He has had no relief with Naprosyn or Tylenol.   History reviewed. No pertinent past medical history.  There are no active problems to display for this patient.   History reviewed. No pertinent past surgical history.  Current Outpatient Rx  Name  Route  Sig  Dispense  Refill  . cyclobenzaprine (FLEXERIL) 10 MG tablet   Oral   Take 1 tablet (10 mg total) by mouth 3 (three) times daily as needed for muscle spasms.   30 tablet   0   . ketorolac (TORADOL) 10 MG tablet   Oral   Take 1 tablet (10 mg total) by mouth every 6 (six) hours as needed.   20 tablet   0     Allergies Review of patient's allergies indicates no known allergies.  No family history on file.  Social History Social History  Substance Use Topics  . Smoking status: Current Every Day Smoker  . Smokeless tobacco: None  . Alcohol Use: No    Review of Systems Constitutional: No recent illness. Eyes: No visual changes. ENT: No sore throat. Cardiovascular: Denies chest pain or palpitations. Respiratory: Denies shortness of breath. Gastrointestinal: No abdominal pain.  Genitourinary: Negative for dysuria. Musculoskeletal: Pain in back and headache Skin: Negative for rash. Neurological: Negative for headaches, focal weakness or numbness. 10-point ROS otherwise  negative.  ____________________________________________   PHYSICAL EXAM:  VITAL SIGNS: ED Triage Vitals  Enc Vitals Group     BP 09/09/15 0826 128/86 mmHg     Pulse Rate 09/09/15 0826 67     Resp 09/09/15 0826 20     Temp 09/09/15 0826 97.8 F (36.6 C)     Temp Source 09/09/15 0826 Oral     SpO2 09/09/15 0826 100 %     Weight 09/09/15 0826 125 lb (56.7 kg)     Height 09/09/15 0826  (1.753 m)     Head Cir --      Peak Flow --      Pain Score 09/09/15 0833 7     Pain Loc --      Pain Edu? --      Excl. in GC? --     Constitutional: Alert and oriented. Well appearing and in no acute distress. Eyes: Conjunctivae are normal. EOMI. Head: Atraumatic. Nose: No congestion/rhinnorhea. Neck: No stridor.  Respiratory: Normal respiratory effort.   Musculoskeletal: Limited range of motion of the back with lateral bending. Right lower lumbar area tender to palpation. Neurologic:  Normal speech and language. No gross focal neurologic deficits are appreciated. Speech is normal. No gait instability. Pupils equal round and reactive to light. Skin:  Skin is warm, dry and intact. Atraumatic. Psychiatric: Mood and affect are normal. Speech and behavior are normal.  ____________________________________________   LABS (all labs ordered are listed, but only abnormal results are displayed)  Labs Reviewed -  No data to display ____________________________________________  RADIOLOGY  Not indicated ____________________________________________   PROCEDURES  Procedure(s) performed: None   ____________________________________________   INITIAL IMPRESSION / ASSESSMENT AND PLAN / ED COURSE  Pertinent labs & imaging results that were available during my care of the patient were reviewed by me and considered in my medical decision making (see chart for details).  IM Toradol was given while in the emergency room. He'll receive a prescription for Toradol and Flexeril. He was advised to  follow-up with orthopedics for symptoms that are not improving over the week. He was advised to return to the emergency room only for symptoms that change or worsen or for new concerns. ____________________________________________   FINAL CLINICAL IMPRESSION(S) / ED DIAGNOSES  Final diagnoses:  Lumbar strain, initial encounter  Episodic tension-type headache, not intractable       Chinita Pester, FNP 09/09/15 1610  Emily Filbert, MD 09/09/15 1229

## 2015-09-09 NOTE — Discharge Instructions (Signed)
General Headache Without Cause A headache is pain or discomfort felt around the head or neck area. The specific cause of a headache may not be found. There are many causes and types of headaches. A few common ones are:  Tension headaches.  Migraine headaches.  Cluster headaches.  Chronic daily headaches. HOME CARE INSTRUCTIONS   Keep all follow-up appointments with your caregiver or any specialist referral.  Only take over-the-counter or prescription medicines for pain or discomfort as directed by your caregiver.  Lie down in a dark, quiet room when you have a headache.  Keep a headache journal to find out what may trigger your migraine headaches. For example, write down:  What you eat and drink.  How much sleep you get.  Any change to your diet or medicines.  Try massage or other relaxation techniques.  Put ice packs or heat on the head and neck. Use these 3 to 4 times per day for 15 to 20 minutes each time, or as needed.  Limit stress.  Sit up straight, and do not tense your muscles.  Quit smoking if you smoke.  Limit alcohol use.  Decrease the amount of caffeine you drink, or stop drinking caffeine.  Eat and sleep on a regular schedule.  Get 7 to 9 hours of sleep, or as recommended by your caregiver.  Keep lights dim if bright lights bother you and make your headaches worse. SEEK MEDICAL CARE IF:   You have problems with the medicines you were prescribed.  Your medicines are not working.  You have a change from the usual headache.  You have nausea or vomiting. SEEK IMMEDIATE MEDICAL CARE IF:   Your headache becomes severe.  You have a fever.  You have a stiff neck.  You have loss of vision.  You have muscular weakness or loss of muscle control.  You start losing your balance or have trouble walking.  You feel faint or pass out.  You have severe symptoms that are different from your first symptoms. MAKE SURE YOU:   Understand these  instructions.  Will watch your condition.  Will get help right away if you are not doing well or get worse. Document Released: 11/23/2005 Document Revised: 02/15/2012 Document Reviewed: 12/09/2011 [facility] Patient Information 2015 [place], [place]. This information is not intended to replace advice given to you by your health care provider. Make sure you discuss any questions you have with your health care provider. Back Pain, Adult Low back pain is very common. About 1 in 5 people have back pain.The cause of low back pain is rarely dangerous. The pain often gets better over time.About half of people with a sudden onset of back pain feel better in just 2 weeks. About 8 in 10 people feel better by 6 weeks.  CAUSES Some common causes of back pain include:  Strain of the muscles or ligaments supporting the spine.  Wear and tear (degeneration) of the spinal discs.  Arthritis.  Direct injury to the back. DIAGNOSIS Most of the time, the direct cause of low back pain is not known.However, back pain can be treated effectively even when the exact cause of the pain is unknown.Answering your caregiver's questions about your overall health and symptoms is one of the most accurate ways to make sure the cause of your pain is not dangerous. If your caregiver needs more information, he or she may order lab work or imaging tests (X-rays or MRIs).However, even if imaging tests show changes in your back, this  usually does not require surgery. HOME CARE INSTRUCTIONS For many people, back pain returns.Since low back pain is rarely dangerous, it is often a condition that people can learn to [facility] their own.   Remain active. It is stressful on the back to sit or stand in one place. Do not sit, drive, or stand in one place for more than 30 minutes at a time. Take short walks on level surfaces as soon as pain allows.Try to increase the length of time you walk each day.  Do not stay in bed.Resting more  than 1 or 2 days can delay your recovery.  Do not avoid exercise or work.Your body is made to move.It is not dangerous to be active, even though your back may hurt.Your back will likely heal faster if you return to being active before your pain is gone.  Pay attention to your body when you bend and lift. Many people have less discomfortwhen lifting if they bend their knees, keep the load close to their bodies,and avoid twisting. Often, the most comfortable positions are those that put less stress on your recovering back.  Find a comfortable position to sleep. Use a firm mattress and lie on your side with your knees slightly bent. If you lie on your back, put a pillow under your knees.  Only take over-the-counter or prescription medicines as directed by your caregiver. Over-the-counter medicines to reduce pain and inflammation are often the most helpful.Your caregiver may prescribe muscle relaxant drugs.These medicines help dull your pain so you can more quickly return to your normal activities and healthy exercise.  Put ice on the injured area.  Put ice in a plastic bag.  Place a towel between your skin and the bag.  Leave the ice on for 15-20 minutes, 03-04 times a day for the first 2 to 3 days. After that, ice and heat may be alternated to reduce pain and spasms.  Ask your caregiver about trying back exercises and gentle massage. This may be of some benefit.  Avoid feeling anxious or stressed.Stress increases muscle tension and can worsen back pain.It is important to recognize when you are anxious or stressed and learn ways to manage it.Exercise is a great option. SEEK MEDICAL CARE IF:  You have pain that is not relieved with rest or medicine.  You have pain that does not improve in 1 week.  You have new symptoms.  You are generally not feeling well. SEEK IMMEDIATE MEDICAL CARE IF:   You have pain that radiates from your back into your legs.  You develop new bowel or  bladder control problems.  You have unusual weakness or numbness in your arms or legs.  You develop nausea or vomiting.  You develop abdominal pain.  You feel faint. Document Released: 11/23/2005 Document Revised: 05/24/2012 Document Reviewed: 03/27/2014 [facility] Patient Information 2015 [place], [place]. This information is not intended to replace advice given to you by your health care provider. Make sure you discuss any questions you have with your health care provider.

## 2015-09-25 ENCOUNTER — Emergency Department: Payer: Self-pay

## 2015-09-25 ENCOUNTER — Emergency Department
Admission: EM | Admit: 2015-09-25 | Discharge: 2015-09-25 | Disposition: A | Discharge status: Home / Self Care | Payer: Self-pay | Attending: Emergency Medicine | Admitting: Emergency Medicine

## 2015-09-25 ENCOUNTER — Encounter: Payer: Self-pay | Admitting: *Deleted

## 2015-09-25 DIAGNOSIS — R0789 Other chest pain: Secondary | ICD-10-CM | POA: Insufficient documentation

## 2015-09-25 DIAGNOSIS — Y998 Other external cause status: Secondary | ICD-10-CM | POA: Insufficient documentation

## 2015-09-25 DIAGNOSIS — Y9289 Other specified places as the place of occurrence of the external cause: Secondary | ICD-10-CM | POA: Insufficient documentation

## 2015-09-25 DIAGNOSIS — R222 Localized swelling, mass and lump, trunk: Secondary | ICD-10-CM | POA: Insufficient documentation

## 2015-09-25 DIAGNOSIS — S39012A Strain of muscle, fascia and tendon of lower back, initial encounter: Secondary | ICD-10-CM | POA: Insufficient documentation

## 2015-09-25 DIAGNOSIS — Y9389 Activity, other specified: Secondary | ICD-10-CM | POA: Insufficient documentation

## 2015-09-25 DIAGNOSIS — X58XXXA Exposure to other specified factors, initial encounter: Secondary | ICD-10-CM | POA: Insufficient documentation

## 2015-09-25 DIAGNOSIS — Z720 Tobacco use: Secondary | ICD-10-CM | POA: Insufficient documentation

## 2015-09-25 LAB — CBC
HEMATOCRIT: 43.4 % (ref 40.0–52.0)
Hemoglobin: 14.7 g/dL (ref 13.0–18.0)
MCH: 31.5 pg (ref 26.0–34.0)
MCHC: 33.9 g/dL (ref 32.0–36.0)
MCV: 92.9 fL (ref 80.0–100.0)
Platelets: 149 10*3/uL — ABNORMAL LOW (ref 150–440)
RBC: 4.67 MIL/uL (ref 4.40–5.90)
RDW: 14.1 % (ref 11.5–14.5)
WBC: 8.1 10*3/uL (ref 3.8–10.6)

## 2015-09-25 LAB — BASIC METABOLIC PANEL
Anion gap: 7 (ref 5–15)
BUN: 20 mg/dL (ref 6–20)
CO2: 27 mmol/L (ref 22–32)
Calcium: 9.8 mg/dL (ref 8.9–10.3)
Chloride: 107 mmol/L (ref 101–111)
Creatinine, Ser: 1.24 mg/dL (ref 0.61–1.24)
Glucose, Bld: 107 mg/dL — ABNORMAL HIGH (ref 65–99)
Potassium: 3.5 mmol/L (ref 3.5–5.1)
Sodium: 141 mmol/L (ref 135–145)

## 2015-09-25 LAB — TROPONIN I: Troponin I: <0.03 ng/mL (ref <0.031)

## 2015-09-25 NOTE — ED Provider Notes (Signed)
[facility] Emergency Department Provider Note  ____________________________________________  Time seen: 2:30 PM  I have reviewed the triage vital signs and the nursing notes.   HISTORY  Chief Complaint Chest Pain    HPI Zachary Williamson is a 24 y.o. male who presents with complaints of a lump on his chest. He noticed it yesterday and reports it is slightly sore. He denies injury to the area. No fevers no chills. No redness. No history of similar. He was going to wait and see if it went away on its own but his work told him he needed to come to be evaluated. He denies short of breath. No recent travel. No cough no shortness of breath. In addition he reports he has mild left lower back pain     History reviewed. No pertinent past medical history.  There are no active problems to display for this patient.   History reviewed. No pertinent past surgical history.  Current Outpatient Rx  Name  Route  Sig  Dispense  Refill  . cyclobenzaprine (FLEXERIL) 10 MG tablet   Oral   Take 1 tablet (10 mg total) by mouth 3 (three) times daily as needed for muscle spasms.   30 tablet   0   . ketorolac (TORADOL) 10 MG tablet   Oral   Take 1 tablet (10 mg total) by mouth every 6 (six) hours as needed.   20 tablet   0     Allergies Review of patient's allergies indicates no known allergies.  No family history on file.  Social History Social History  Substance Use Topics  . Smoking status: Current Every Day Smoker  . Smokeless tobacco: None  . Alcohol Use: No    Review of Systems  Constitutional: Negative for fever. Eyes: Negative for visual changes. ENT: Negative for sore throat Cardiovascular: Negative for chest pain. Respiratory: Negative for shortness of breath. Gastrointestinal: Negative for abdominal pain, vomiting and diarrhea. Genitourinary: Negative for dysuria. Musculoskeletal: Positive for back pain Skin: Negative for rash. Positive for  lump Neurological: Negative for headaches or focal weakness Psychiatric: No anxiety    ____________________________________________   PHYSICAL EXAM:  VITAL SIGNS: BP 118/72 mmHg  Pulse 50  Resp 14  SpO2 100%                                                           --      Constitutional: Alert and oriented. Well appearing and in no distress. Eyes: Conjunctivae are normal.  ENT   Head: Normocephalic and atraumatic.   Mouth/Throat: Mucous membranes are moist. Cardiovascular: Normal rate, regular rhythm. Normal and symmetric distal pulses are present in all extremities. No murmurs, rubs, or gallops. Patient does have small area of tenderness just left of the sternum, centrally consistent with localized area of inflammation. No evidence of abscess or cellulitis. No bony abnormalities. Respiratory: Normal respiratory effort without tachypnea nor retractions. Breath sounds are clear and equal bilaterally.  Gastrointestinal: Soft and non-tender in all quadrants. No distention. There is no CVA tenderness. Genitourinary: deferred Musculoskeletal: Nontender with normal range of motion in all extremities. No lower extremity tenderness nor edema. Patient has mild tenderness to palpation in the left lower back consistent with a muscle strain Neurologic:  Normal speech and language. No gross focal neurologic  deficits are appreciated. Skin:  Skin is warm, dry and intact. No rash noted. Psychiatric: Mood and affect are normal. Patient exhibits appropriate insight and judgment.  ____________________________________________    LABS (pertinent positives/negatives)  Labs Reviewed  BASIC METABOLIC PANEL - Abnormal; Notable for the following:    Glucose, Bld 107 (*)    All other components within normal limits  CBC - Abnormal; Notable for the following:    Platelets 149 (*)    All other components within normal limits  TROPONIN I     ____________________________________________   EKG  ED ECG REPORT I, Jene Every, the attending physician, personally viewed and interpreted this ECG.  Date: 09/25/2015 EKG Time: 12:24 PM Rate: 67 Rhythm: normal sinus rhythm QRS Axis: normal Intervals: normal ST/T Wave abnormalities: normal Conduction Disutrbances: none Narrative Interpretation: unremarkable   ____________________________________________    RADIOLOGY I have personally reviewed any xrays that were ordered on this patient: None  ____________________________________________   PROCEDURES  Procedure(s) performed: none  Critical Care performed: none  ____________________________________________   INITIAL IMPRESSION / ASSESSMENT AND PLAN / ED COURSE  Pertinent labs & imaging results that were available during my care of the patient were reviewed by me and considered in my medical decision making (see chart for details).  Patient well-appearing and in no distress. Localized area of inflammation on the chest unclear origin. I recommended NSAIDs and ice and follow-up in 5 days if no improvement.  EKG labs are unremarkable. He is perc negative and this is not consistent with ACS  ____________________________________________   FINAL CLINICAL IMPRESSION(S) / ED DIAGNOSES  Final diagnoses:  Chest wall pain     Jene Every, MD 09/25/15 1454

## 2015-09-25 NOTE — ED Notes (Signed)
Pt reports centalized chest pain since yesterday. States there is a bump on his chest. Also having some pain on left flank area.

## 2015-09-25 NOTE — Discharge Instructions (Signed)

## 2019-06-16 ENCOUNTER — Other Ambulatory Visit: Payer: Self-pay

## 2019-06-16 DIAGNOSIS — A6920 Lyme disease, unspecified: Secondary | ICD-10-CM | POA: Insufficient documentation

## 2019-06-16 DIAGNOSIS — F17200 Nicotine dependence, unspecified, uncomplicated: Secondary | ICD-10-CM | POA: Insufficient documentation

## 2019-06-16 DIAGNOSIS — R252 Cramp and spasm: Secondary | ICD-10-CM | POA: Insufficient documentation

## 2019-06-16 NOTE — ED Triage Notes (Signed)
Pt ambulatory to triage with no difficulty. Pt reports he found a tick on his left shoulder blade region about 1 month ago. Yesterday area started itching and he had someone look at it. They removed what they thought was the ticks head from the skin. States area is now itching worse.

## 2019-06-17 ENCOUNTER — Emergency Department
Admission: EM | Admit: 2019-06-17 | Discharge: 2019-06-17 | Disposition: A | Discharge status: Home / Self Care | Payer: Self-pay | Attending: Emergency Medicine | Admitting: Emergency Medicine

## 2019-06-17 ENCOUNTER — Other Ambulatory Visit: Payer: Self-pay

## 2019-06-17 DIAGNOSIS — F17200 Nicotine dependence, unspecified, uncomplicated: Secondary | ICD-10-CM | POA: Insufficient documentation

## 2019-06-17 DIAGNOSIS — B882 Other arthropod infestations: Secondary | ICD-10-CM | POA: Insufficient documentation

## 2019-06-17 DIAGNOSIS — A6920 Lyme disease, unspecified: Secondary | ICD-10-CM

## 2019-06-17 LAB — CBC
HCT: 45.1 % (ref 39.0–52.0)
Hemoglobin: 15.1 g/dL (ref 13.0–17.0)
MCH: 29.6 pg (ref 26.0–34.0)
MCHC: 33.5 g/dL (ref 30.0–36.0)
MCV: 88.4 fL (ref 80.0–100.0)
Platelets: 108 10*3/uL — ABNORMAL LOW (ref 150–400)
RBC: 5.1 MIL/uL (ref 4.22–5.81)
RDW: 13.5 % (ref 11.5–15.5)
WBC: 8.1 10*3/uL (ref 4.0–10.5)
nRBC: 0 % (ref 0.0–0.2)

## 2019-06-17 LAB — COMPREHENSIVE METABOLIC PANEL
ALT: 15 U/L (ref 0–44)
AST: 27 U/L (ref 15–41)
Albumin: 4.8 g/dL (ref 3.5–5.0)
Alkaline Phosphatase: 71 U/L (ref 38–126)
Anion gap: 9 (ref 5–15)
BUN: 13 mg/dL (ref 6–20)
CO2: 27 mmol/L (ref 22–32)
Calcium: 9.5 mg/dL (ref 8.9–10.3)
Chloride: 102 mmol/L (ref 98–111)
Creatinine, Ser: 1.25 mg/dL — ABNORMAL HIGH (ref 0.61–1.24)
GFR calc Af Amer: >60 mL/min (ref >60)
GFR calc non Af Amer: >60 mL/min (ref >60)
Glucose, Bld: 125 mg/dL — ABNORMAL HIGH (ref 70–99)
Potassium: 3.2 mmol/L — ABNORMAL LOW (ref 3.5–5.1)
Sodium: 138 mmol/L (ref 135–145)
Total Bilirubin: 1.2 mg/dL (ref 0.3–1.2)
Total Protein: 8.3 g/dL — ABNORMAL HIGH (ref 6.5–8.1)

## 2019-06-17 LAB — LIPASE, BLOOD: Lipase: 24 U/L (ref 11–51)

## 2019-06-17 MED ORDER — DOXYCYCLINE MONOHYDRATE 100 MG PO TABS: 100.0000 mg | ORAL_TABLET | Freq: Two times a day (BID) | ORAL | 0 refills | Status: AC

## 2019-06-17 MED ORDER — DOXYCYCLINE HYCLATE 100 MG PO TABS
100.0000 mg | ORAL_TABLET | Freq: Once | ORAL | Status: AC
  Administered 2019-06-17: 100 mg via ORAL
  Filled 2019-06-17: qty 1

## 2019-06-17 NOTE — ED Triage Notes (Signed)
Pt states found a tick on himself a month ago. Pt was diagnosed with lyme disease last night per pt. Pt states tonight he began to experience generalized abd pain. Pt states he called the emergency department and "they told me to come back within the hour". Pt denies vomiting, diarrhea, fever.

## 2019-06-17 NOTE — ED Provider Notes (Signed)
[facility] Emergency Department Provider Note ____   First MD Initiated Contact with Patient 06/17/19 0011     (approximate)  I have reviewed the triage vital signs and the nursing notes.   HISTORY  Chief Complaint No chief complaint on file.    HPI Zachary Williamson is a 28 y.o. male presents emergency department with history of having a tick that was removed from his back approximately a month ago however he states that area is been particularly tender and pruritic mats someone noted that there was still the "head of the tick in his back yesterday and had a such removed it.  Patient states that since that time he has had increasing discomfort and pruritus in the area.  Patient also admits to abdominal cramping earlier this evening.  Patient denies any fever.         No past medical history on file.  There are no active problems to display for this patient.   No past surgical history on file.  Prior to Admission medications   Medication Sig Start Date End Date Taking? Authorizing Provider  cyclobenzaprine (FLEXERIL) 10 MG tablet Take 1 tablet (10 mg total) by mouth 3 (three) times daily as needed for muscle spasms. 09/09/15   Triplett, Rulon Eisenmengerari B, FNP  ketorolac (TORADOL) 10 MG tablet Take 1 tablet (10 mg total) by mouth every 6 (six) hours as needed. 09/09/15   Chinita Pesterriplett, Cari B, FNP    Allergies Patient has no known allergies.  No family history on file.  Social History Social History   Tobacco Use  . Smoking status: Current Every Day Smoker  Substance Use Topics  . Alcohol use: No  . Drug use: Yes    Comment: 3 weeks ago (09/12/14) sts he "doesn't anymore"    Review of Systems Constitutional: No fever/chills Eyes: No visual changes. ENT: No sore throat. Cardiovascular: Denies chest pain. Respiratory: Denies shortness of breath. Gastrointestinal: No abdominal pain.  No nausea, no vomiting.  No diarrhea.  No constipation. Genitourinary:  Negative for dysuria. Musculoskeletal: Negative for neck pain.  Negative for back pain. Integumentary: Positive for tender pruritic rash left upper back Neurological: Negative for headaches, focal weakness or numbness.   ____________________________________________   PHYSICAL EXAM:  VITAL SIGNS: ED Triage Vitals  Enc Vitals Group     BP 06/16/19 2121 120/71     Pulse Rate 06/16/19 2121 (!) 58     Resp 06/16/19 2121 18     Temp 06/16/19 2121 98.6 F (37 C)     Temp Source 06/16/19 2121 Oral     SpO2 06/16/19 2121 100 %     Weight 06/16/19 2123 59 kg (130 lb)     Height 06/16/19 2123 1.753 m (5\' 9" )     Head Circumference --      Peak Flow --      Pain Score 06/16/19 2122 6     Pain Loc --      Pain Edu? --      Excl. in GC? --     Constitutional: Alert and oriented. Well appearing and in no acute distress. Eyes: Conjunctivae are normal.  Mouth/Throat: Mucous membranes are moist. Oropharynx non-erythematous. Neck: No stridor.   Cardiovascular: Normal rate, regular rhythm. Good peripheral circulation. Grossly normal heart sounds. Respiratory: Normal respiratory effort.  No retractions. No audible wheezing. Gastrointestinal: Soft and nontender. No distention.  Musculoskeletal: No lower extremity tenderness nor edema. No gross deformities of extremities. Neurologic:  Normal  speech and language. No gross focal neurologic deficits are appreciated.  Skin: 9 x 5 cm area of blanching erythema with central wound with surrounding clearing noted inferior portion of the left scapula. Psychiatric: Mood and affect are normal. Speech and behavior are normal.    Procedures   ____________________________________________   INITIAL IMPRESSION / MDM / ASSESSMENT AND PLAN / ED COURSE  As part of my medical decision making, I reviewed the following data within the electronic MEDICAL RECORD NUMBER  28 year old male presenting with above-stated history and physical exam secondary to tick bite  with suspected Lyme disease.  Patient given doxycycline 100 mg p.o. in emergency department will be prescribed the same for home.     ____________________________________________  FINAL CLINICAL IMPRESSION(S) / ED DIAGNOSES  Final diagnoses:  Lyme disease     MEDICATIONS GIVEN DURING THIS VISIT:  Medications  doxycycline (VIBRA-TABS) tablet 100 mg (has no administration in time range)     ED Discharge Orders    None      *Please note:  Zachary Williamson was evaluated in Emergency Department on 06/17/2019 for the symptoms described in the history of present illness. He was evaluated in the context of the global COVID-19 pandemic, which necessitated consideration that the patient might be at risk for infection with the SARS-CoV-2 virus that causes COVID-19. Institutional protocols and algorithms that pertain to the evaluation of patients at risk for COVID-19 are in a state of rapid change based on information released by regulatory bodies including the CDC and federal and state organizations. These policies and algorithms were followed during the patient's care in the ED.  Some ED evaluations and interventions may be delayed as a result of limited staffing during the pandemic.*  Note:  This document was prepared using Dragon voice recognition software and may include unintentional dictation errors.   Gregor Hams, MD 06/17/19 [phone]

## 2019-06-18 ENCOUNTER — Emergency Department
Admission: EM | Admit: 2019-06-18 | Discharge: 2019-06-18 | Disposition: A | Discharge status: Home / Self Care | Payer: Self-pay | Attending: Emergency Medicine | Admitting: Emergency Medicine

## 2019-06-18 DIAGNOSIS — M791 Myalgia, unspecified site: Secondary | ICD-10-CM

## 2019-06-18 DIAGNOSIS — B882 Other arthropod infestations: Secondary | ICD-10-CM

## 2019-06-18 NOTE — ED Provider Notes (Signed)
[facility] Emergency Department Provider Note  ____________________________________________   First MD Initiated Contact with Patient 06/18/19 [phone]     (approximate)  I have reviewed the triage vital signs and the nursing notes.   HISTORY  Chief Complaint Abdominal Pain    HPI Zachary Williamson is a 28 y.o. male    who denies any chronic medical issues and presents for evaluation of some generalized abdominal pain and pain in his left ribs.  He states that he was seen in the ED last night and diagnosed with Lyme disease and started on doxycycline.  He has had 2 doses of the doxycycline, so far.  He said he started having some aches in his body but no other symptoms.  He claims that he called the emergency department and was to come back immediately "within the hour".  He is currently in no distress and reports that the pain is mild.  Nothing in particular makes it better or worse.  He denies fever, chills, sore throat, shortness of breath, cough, nausea, and vomiting.  His rash is isolated to the left scapula and has not gotten any better or worse at least in terms of spreading to other parts of his body.        No past medical history on file.  There are no active problems to display for this patient.   No past surgical history on file.  Prior to Admission medications   Medication Sig Start Date End Date Taking? Authorizing Provider  cyclobenzaprine (FLEXERIL) 10 MG tablet Take 1 tablet (10 mg total) by mouth 3 (three) times daily as needed for muscle spasms. 09/09/15   Triplett, Cari B, FNP  doxycycline (ADOXA) 100 MG tablet Take 1 tablet (100 mg total) by mouth 2 (two) times daily for 14 days. 06/17/19 07/01/19  Darci CurrentBrown, [place] N, MD  ketorolac (TORADOL) 10 MG tablet Take 1 tablet (10 mg total) by mouth every 6 (six) hours as needed. 09/09/15   Chinita Pesterriplett, Cari B, FNP    Allergies Patient has no known allergies.  No family history on file.  Social  History Social History   Tobacco Use   Smoking status: Current Every Day Smoker  Substance Use Topics   Alcohol use: No   Drug use: Yes    Comment: 3 weeks ago (09/12/14) sts he "doesn't anymore"    Review of Systems Constitutional: No fever/chills Eyes: No visual changes. ENT: No sore throat. Cardiovascular: Denies chest pain. Respiratory: Denies shortness of breath. Gastrointestinal: generalized mild abdominal pain Genitourinary: Negative for dysuria. Musculoskeletal: Some myalgias, pain in left side of chest Integumentary: Negative for rash. Neurological: Negative for headaches, focal weakness or numbness.   ____________________________________________   PHYSICAL EXAM:  VITAL SIGNS: ED Triage Vitals [06/17/19 2223]  Enc Vitals Group     BP 115/88     Pulse Rate 99     Resp 16     Temp 98.6 F (37 C)     Temp Source Oral     SpO2 100 %     Weight 59 kg (130 lb)     Height 1.753 m (5\' 9" )     Head Circumference      Peak Flow      Pain Score 7     Pain Loc      Pain Edu?      Excl. in GC?     Constitutional: Alert and oriented. Well appearing and in no acute distress. Eyes: Conjunctivae are  normal.  Head: Atraumatic. Nose: No congestion/rhinnorhea. Mouth/Throat: Mucous membranes are moist. Neck: No stridor.  No meningeal signs.   Cardiovascular: Normal rate, regular rhythm. Good peripheral circulation. Grossly normal heart sounds. Respiratory: Normal respiratory effort.  No retractions. No audible wheezing. Gastrointestinal: Soft and nontender. No distention.  Musculoskeletal: No lower extremity tenderness nor edema. No gross deformities of extremities. Neurologic:  Normal speech and language. No gross focal neurologic deficits are appreciated.  Skin:  Skin is warm, dry and intact.  The patient has an area of blanching erythema on the left scapula that apparently is smaller than the 9 x 5 cm area described in the note from last night.  There is no  evidence of spreading cellulitis and no other concerning rash or signs of acute infection. Psychiatric: Mood and affect are normal. Speech and behavior are normal.  ____________________________________________   LABS (all labs ordered are listed, but only abnormal results are displayed)  Labs Reviewed  COMPREHENSIVE METABOLIC PANEL - Abnormal; Notable for the following components:      Result Value   Potassium 3.2 (*)    Glucose, Bld 125 (*)    Creatinine, Ser 1.25 (*)    Total Protein 8.3 (*)    All other components within normal limits  CBC - Abnormal; Notable for the following components:   Platelets 108 (*)    All other components within normal limits  LIPASE, BLOOD  URINALYSIS, COMPLETE (UACMP) WITH MICROSCOPIC   ____________________________________________  EKG  No indication for EKG ____________________________________________  RADIOLOGY   ED MD interpretation: No indication for imaging  Official radiology report(s): No results found.  ____________________________________________   PROCEDURES   Procedure(s) performed (including Critical Care):  Procedures   ____________________________________________   INITIAL IMPRESSION / MDM / ASSESSMENT AND PLAN / ED COURSE  As part of my medical decision making, I reviewed the following data within the electronic MEDICAL RECORD NUMBER Nursing notes reviewed and incorporated, Labs reviewed , Old chart reviewed and Notes from prior ED visits   Differential diagnosis includes, but is not limited to, persistent tickborne illness, viral infection, COVID-19, medication side effect.  The patient is well-appearing and waited in the emergency department for about 5 hours to be seen.  I observed him ambulating around the room without any difficulty.  He denies any nausea or vomiting.  He has no tenderness to palpation on exam.  Based on comparing my exam tonight and his note from last night, it seems that the cellulitic patch on  his left scapula is already improving.  There is no sign of an abscess.  I am less certain that the patient is suffering from Lyme disease but certainly any tickborne illness is possible.  I strongly encouraged him to take the full course of doxycycline but I reassured him that his labs are all within normal limits.  I offered to write him a prescription for Zofran but he declined.  We talked about alternating doses of ibuprofen and Tylenol to help with the pain.  He agreed with the plan.  Of note he eloped from the department prior to signing out or receiving his discharge paperwork.          ____________________________________________  FINAL CLINICAL IMPRESSION(S) / ED DIAGNOSES  Final diagnoses:  Tick-borne disease  Myalgia     MEDICATIONS GIVEN DURING THIS VISIT:  Medications - No data to display   ED Discharge Orders    None      *Please note:  Andras E Chiara was  evaluated in Emergency Department on 06/18/2019 for the symptoms described in the history of present illness. He was evaluated in the context of the global COVID-19 pandemic, which necessitated consideration that the patient might be at risk for infection with the SARS-CoV-2 virus that causes COVID-19. Institutional protocols and algorithms that pertain to the evaluation of patients at risk for COVID-19 are in a state of rapid change based on information released by regulatory bodies including the CDC and federal and state organizations. These policies and algorithms were followed during the patient's care in the ED.  Some ED evaluations and interventions may be delayed as a result of limited staffing during the pandemic.*  Note:  This document was prepared using Dragon voice recognition software and may include unintentional dictation errors.   Hinda Kehr, MD 06/18/19 [phone]

## 2019-06-18 NOTE — ED Notes (Signed)
MD Forbach at bedside. 

## 2019-06-18 NOTE — Discharge Instructions (Addendum)
As we discussed, your lab work is all within normal limits today and there is no evidence of an emergent medical condition at this time.  Please be sure to take the full course of antibiotics you were prescribed last night.  Alternate doses of ibuprofen and Tylenol so that you take one or the other every 3 hours, but do not take the same medicine more often than every 6 hours.  You can take up to Tylenol 1000 mg and ibuprofen 600 mg each time you take it.    Return to the emergency department if you develop new or worsening symptoms that concern you.

## 2019-06-18 NOTE — ED Notes (Addendum)
Pt seen leaving room at this time. MD Karma Greaser had assessed pt but pt did not wait for final VS, to sign DC, or to receive paperwork.

## 2019-08-24 ENCOUNTER — Other Ambulatory Visit: Payer: Self-pay

## 2019-08-24 ENCOUNTER — Emergency Department
Admission: EM | Admit: 2019-08-24 | Discharge: 2019-08-24 | Disposition: A | Discharge status: Home / Self Care | Payer: Self-pay | Attending: Emergency Medicine | Admitting: Emergency Medicine

## 2019-08-24 ENCOUNTER — Encounter: Payer: Self-pay | Admitting: Emergency Medicine

## 2019-08-24 DIAGNOSIS — Z79899 Other long term (current) drug therapy: Secondary | ICD-10-CM | POA: Insufficient documentation

## 2019-08-24 DIAGNOSIS — D899 Disorder involving the immune mechanism, unspecified: Secondary | ICD-10-CM | POA: Insufficient documentation

## 2019-08-24 DIAGNOSIS — L509 Urticaria, unspecified: Secondary | ICD-10-CM | POA: Insufficient documentation

## 2019-08-24 DIAGNOSIS — F17200 Nicotine dependence, unspecified, uncomplicated: Secondary | ICD-10-CM | POA: Insufficient documentation

## 2019-08-24 HISTORY — DX: Immunodeficiency, unspecified: D84.9

## 2019-08-24 HISTORY — DX: Lyme disease, unspecified: A69.20

## 2019-08-24 MED ORDER — DEXAMETHASONE SODIUM PHOSPHATE 10 MG/ML IJ SOLN
10.0000 mg | Freq: Once | INTRAMUSCULAR | Status: AC
  Administered 2019-08-24: 10 mg via INTRAMUSCULAR
  Filled 2019-08-24: qty 1

## 2019-08-24 MED ORDER — PREDNISONE 10 MG PO TABS: ORAL_TABLET | ORAL | 0 refills | Status: DC

## 2019-08-24 MED ORDER — FAMOTIDINE 20 MG PO TABS: 20.0000 mg | ORAL_TABLET | Freq: Two times a day (BID) | ORAL | 0 refills | Status: DC

## 2019-08-24 MED ORDER — DIPHENHYDRAMINE HCL 25 MG PO CAPS
50.0000 mg | ORAL_CAPSULE | Freq: Once | ORAL | Status: AC
  Administered 2019-08-24: 50 mg via ORAL
  Filled 2019-08-24: qty 2

## 2019-08-24 NOTE — ED Provider Notes (Signed)
[facility] Emergency Department Provider Note  ____________________________________________   First MD Initiated Contact with Patient 08/24/19 [phone]     (approximate)  I have reviewed the triage vital signs and the nursing notes.   HISTORY  Chief Complaint Rash   HPI Zachary Williamson is a 28 y.o. male presents to the ED with complaint of rash that started last p.m.  Patient has not taken any over-the-counter medication for his rash.  He states he has been up all night scratching.  He denies any previous problems and has had no difficulty breathing.  He also states he had a tick bite 1 month ago.  He rates his itching as an 8 out of 10.       Past Medical History:  Diagnosis Date  . Immune deficiency disorder ([place])   . Lyme disease     There are no active problems to display for this patient.   History reviewed. No pertinent surgical history.  Prior to Admission medications   Medication Sig Start Date End Date Taking? Authorizing Provider  famotidine (PEPCID) 20 MG tablet Take 1 tablet (20 mg total) by mouth 2 (two) times daily. 08/24/19 08/23/20  Johnn Hai, PA-C  predniSONE (DELTASONE) 10 MG tablet Take 6 tablets  today, on day 2 take 5 tablets, day 3 take 4 tablets, day 4 take 3 tablets, day 5 take  2 tablets and 1 tablet the last day 08/24/19   Johnn Hai, PA-C    Allergies Patient has no known allergies.  No family history on file.  Social History Social History   Tobacco Use  . Smoking status: Current Every Day Smoker  . Smokeless tobacco: Never Used  Substance Use Topics  . Alcohol use: No  . Drug use: Yes    Comment: 3 weeks ago (09/12/14) sts he "doesn't anymore"    Review of Systems Constitutional: No fever/chills Cardiovascular: Denies chest pain. Respiratory: Denies shortness of breath. Gastrointestinal: No abdominal pain.  No nausea, no vomiting.  Musculoskeletal: Negative for back pain. Skin: Positive for  rash. Neurological: Negative for headaches, focal weakness or numbness. ____________________________________________   PHYSICAL EXAM:  VITAL SIGNS: ED Triage Vitals  Enc Vitals Group     BP 08/24/19 0707 127/78     Pulse Rate 08/24/19 0707 73     Resp 08/24/19 0707 14     Temp 08/24/19 0707 97.7 F (36.5 C)     Temp Source 08/24/19 0707 Oral     SpO2 08/24/19 0707 100 %     Weight 08/24/19 0708 125 lb (56.7 kg)     Height 08/24/19 0708 5\' 9"  (1.753 m)     Head Circumference --      Peak Flow --      Pain Score 08/24/19 0707 8     Pain Loc --      Pain Edu? --      Excl. in [place]? --     Constitutional: Alert and oriented. Well appearing and in no acute distress. Eyes: Conjunctivae are normal.  Head: Atraumatic. Nose: No congestion/rhinnorhea. Mouth/Throat: Mucous membranes are moist.  Oropharynx non-erythematous.  No edema. Neck: No stridor.   Cardiovascular: Normal rate, regular rhythm. Grossly normal heart sounds.  Good peripheral circulation. Respiratory: Normal respiratory effort.  No retractions. Lungs CTAB. Gastrointestinal: Soft and nontender. No distention. No abdominal bruits. No CVA tenderness. Musculoskeletal: No lower extremity tenderness nor edema.  No joint effusions. Neurologic:  Normal speech and language. No gross  focal neurologic deficits are appreciated. No gait instability. Skin:  Skin is warm, dry and intact.  There is an old area that is healing on the left scapular area where patient states he had a tick bite.  This was from 1 month ago.  Nontender no erythema was noted.  Patient has irregular diffuse rash on the back and upper thighs.  Macular irregular areas and also evidence that patient has been scratching at them frequently. Psychiatric: Mood and affect are normal. Speech and behavior are normal.  ____________________________________________   LABS (all labs ordered are listed, but only abnormal results are displayed)  Labs Reviewed - No data to  display   PROCEDURES  Procedure(s) performed (including Critical Care):  Procedures   ____________________________________________   INITIAL IMPRESSION / ASSESSMENT AND PLAN / ED COURSE  As part of my medical decision making, I reviewed the following data within the electronic MEDICAL RECORD NUMBER Notes from prior ED visits and [place] Controlled Substance Database  28 year old male presents to the ED with complaint of rash that began last evening that has been itching him and kept him up "all night".  Patient has not taken any over-the-counter medication for his itching.  He denies any fever, chills, nausea or vomiting.  He denies any recent allergens or difficulty breathing.  Areas are consistent with hives.  Patient was given Benadryl 50 mg p.o. and Decadron 10 mg IM while in the ED.  There was improvement prior to his discharge.  Patient was discharged with prescription for Pepcid 20 mg twice daily for the next 2 weeks.  He was also given a tapering dose of prednisone to begin today.  ____________________________________________   FINAL CLINICAL IMPRESSION(S) / ED DIAGNOSES  Final diagnoses:  Hives     ED Discharge Orders         Ordered    predniSONE (DELTASONE) 10 MG tablet     08/24/19 0841    famotidine (PEPCID) 20 MG tablet  2 times daily     08/24/19 0841           Note:  This document was prepared using Dragon voice recognition software and may include unintentional dictation errors.    Tommi RumpsSummers, Rhonda L, PA-C 08/24/19 1135    Arnaldo NatalMalinda, Paul F, MD 08/24/19 [phone]

## 2019-08-24 NOTE — ED Triage Notes (Signed)
Rash and itching all over

## 2019-08-24 NOTE — Discharge Instructions (Signed)
Return to the emergency department if any severe worsening of your symptoms or not improving.  Begin taking medication as directed.  The prednisone is 6 tablets starting today and tapering down by 1 over the next 6 days.  Also Pepcid is 1 tablet twice a day for the next 15 days.  You will need to take Benadryl 2 capsules at bedtime.  This medication could make you drowsy and should only be taken if you are at home.  If you are not working today take 2 capsules every 6 hours.  The first dose was given to you in the ED today.

## 2019-08-24 NOTE — ED Notes (Signed)
See triage note  Presents with generalized rash since last pm   Positive itching   Denies any fever or resp distress

## 2019-09-14 ENCOUNTER — Other Ambulatory Visit: Payer: Self-pay

## 2019-09-14 ENCOUNTER — Emergency Department: Payer: Self-pay

## 2019-09-14 ENCOUNTER — Emergency Department
Admission: EM | Admit: 2019-09-14 | Discharge: 2019-09-14 | Disposition: A | Discharge status: Home / Self Care | Payer: Self-pay | Attending: Emergency Medicine | Admitting: Emergency Medicine

## 2019-09-14 DIAGNOSIS — Z79899 Other long term (current) drug therapy: Secondary | ICD-10-CM | POA: Insufficient documentation

## 2019-09-14 DIAGNOSIS — R2242 Localized swelling, mass and lump, left lower limb: Secondary | ICD-10-CM | POA: Insufficient documentation

## 2019-09-14 DIAGNOSIS — M25562 Pain in left knee: Secondary | ICD-10-CM | POA: Insufficient documentation

## 2019-09-14 DIAGNOSIS — F17200 Nicotine dependence, unspecified, uncomplicated: Secondary | ICD-10-CM | POA: Insufficient documentation

## 2019-09-14 MED ORDER — MELOXICAM 15 MG PO TABS: 15.0000 mg | ORAL_TABLET | Freq: Every day | ORAL | 1 refills | Status: AC

## 2019-09-14 NOTE — ED Notes (Signed)
Pt states woke this morning w/ L knee swelling and pain w/ movement and palpation. Pt denies injury. Pulses distal to L knee are 2+, skin warm, dry, appropriate coloration, no swelling distal to L knee.

## 2019-09-14 NOTE — ED Provider Notes (Signed)
[facility] Emergency Department Provider Note  ____________________________________________  Time seen: Approximately 11:11 PM  I have reviewed the triage vital signs and the nursing notes.   HISTORY  Chief Complaint Joint Swelling    HPI Zachary Williamson is a 28 y.o. male presents to the emergency department with acute 8 out of 10 left knee pain that has worsened acutely today.  Patient reports a history of left knee pain in the past but states that he does not have pain chronically.  He denies new falls or mechanisms of trauma.  Pain is worse with palpation over the patella and with climbing stairs.  No numbness or tingling in the lower extremities.  No other alleviating measures have been attempted.        Past Medical History:  Diagnosis Date  . Immune deficiency disorder (HCC)   . Lyme disease     There are no active problems to display for this patient.   No past surgical history on file.  Prior to Admission medications   Medication Sig Start Date End Date Taking? Authorizing Provider  famotidine (PEPCID) 20 MG tablet Take 1 tablet (20 mg total) by mouth 2 (two) times daily. 08/24/19 08/23/20  Tommi Rumps, PA-C  meloxicam (MOBIC) 15 MG tablet Take 1 tablet (15 mg total) by mouth daily for 7 days. 09/14/19 09/21/19  Orvil Feil, PA-C  predniSONE (DELTASONE) 10 MG tablet Take 6 tablets  today, on day 2 take 5 tablets, day 3 take 4 tablets, day 4 take 3 tablets, day 5 take  2 tablets and 1 tablet the last day 08/24/19   Tommi Rumps, PA-C    Allergies Patient has no known allergies.  No family history on file.  Social History Social History   Tobacco Use  . Smoking status: Current Every Day Smoker  . Smokeless tobacco: Never Used  Substance Use Topics  . Alcohol use: No  . Drug use: Yes    Comment: 3 weeks ago (09/12/14) sts he "doesn't anymore"     Review of Systems  Constitutional: No fever/chills Eyes: No visual  changes. No discharge ENT: No upper respiratory complaints. Cardiovascular: no chest pain. Respiratory: no cough. No SOB. Gastrointestinal: No abdominal pain.  No nausea, no vomiting.  No diarrhea.  No constipation. Musculoskeletal: Patient has left knee pain.  Skin: Negative for rash, abrasions, lacerations, ecchymosis. Neurological: Negative for headaches, focal weakness or numbness.   ____________________________________________   PHYSICAL EXAM:  VITAL SIGNS: ED Triage Vitals [09/14/19 2143]  Enc Vitals Group     BP (!) 159/113     Pulse Rate 81     Resp 20     Temp 98.1 F (36.7 C)     Temp Source Oral     SpO2 100 %     Weight 129 lb (58.5 kg)     Height 5\' 9"  (1.753 m)     Head Circumference      Peak Flow      Pain Score 9     Pain Loc      Pain Edu?      Excl. in GC?      Constitutional: Alert and oriented. Well appearing and in no acute distress. Eyes: Conjunctivae are normal. PERRL. EOMI. Head: Atraumatic. Cardiovascular: Normal rate, regular rhythm. Normal S1 and S2.  Good peripheral circulation. Respiratory: Normal respiratory effort without tachypnea or retractions. Lungs CTAB. Good air entry to the bases with no decreased or absent breath sounds.  Gastrointestinal: Bowel sounds 4 quadrants. Soft and nontender to palpation. No guarding or rigidity. No palpable masses. No distention. No CVA tenderness. Musculoskeletal: Patient has positive apprehension test on the left.  Negative ballottement.  Patient has general tenderness with other provocative testing but no notable deficits.  Palpable dorsalis pedis pulse, left. Neurologic:  Normal speech and language. No gross focal neurologic deficits are appreciated.  Skin:  Skin is warm, dry and intact. No rash noted. Psychiatric: Mood and affect are normal. Speech and behavior are normal. Patient exhibits appropriate insight and judgement.   ____________________________________________   LABS (all labs ordered  are listed, but only abnormal results are displayed)  Labs Reviewed - No data to display ____________________________________________  EKG   ____________________________________________  RADIOLOGY I personally viewed and evaluated these images as part of my medical decision making, as well as reviewing the written report by the radiologist.  Dg Knee Complete 4 Views Left  Result Date: 09/14/2019 CLINICAL DATA:  Left knee pain and swelling EXAM: LEFT KNEE - COMPLETE 4+ VIEW COMPARISON:  None. FINDINGS: No fracture or dislocation. A small knee joint effusion is seen. There is edema within Hoffa's fat pad. No evidence of arthropathy or other focal bone abnormality. Soft tissues are unremarkable. IMPRESSION: Small knee joint effusion. Electronically Signed   By: Prudencio Pair M.D.   On: 09/14/2019 23:05    ____________________________________________    PROCEDURES  Procedure(s) performed:    Procedures    Medications - No data to display   ____________________________________________   INITIAL IMPRESSION / ASSESSMENT AND PLAN / ED COURSE  Pertinent labs & imaging results that were available during my care of the patient were reviewed by me and considered in my medical decision making (see chart for details).  Review of the [place] CSRS was performed in accordance of the [place] prior to dispensing any controlled drugs.           Assessment and plan Left knee pain Patient presents to the emergency department with acute left knee pain that started today.  X-ray examination reveals no acute abnormality.  On physical exam, patient had pain with apprehension.  Recommended quad strengthening exercises.  He was discharged with meloxicam and advised to follow-up with orthopedics as needed.  All patient questions were answered.   ____________________________________________  FINAL CLINICAL IMPRESSION(S) / ED DIAGNOSES  Final diagnoses:  Acute pain of left knee      NEW  MEDICATIONS STARTED DURING THIS VISIT:  ED Discharge Orders         Ordered    meloxicam (MOBIC) 15 MG tablet  Daily     09/14/19 2309              This chart was dictated using voice recognition software/Dragon. Despite best efforts to proofread, errors can occur which can change the meaning. Any change was purely unintentional.    Lannie Fields, PA-C 09/14/19 2314    Nena Polio, MD 09/15/19 2135

## 2019-09-14 NOTE — ED Triage Notes (Signed)
Pt states woke up this am with left knee swelling, denies any injury. Pt states episode of the same years ago but unsure of dx.

## 2019-11-09 ENCOUNTER — Encounter: Payer: Self-pay | Admitting: Emergency Medicine

## 2019-11-09 ENCOUNTER — Emergency Department
Admission: EM | Admit: 2019-11-09 | Discharge: 2019-11-09 | Disposition: A | Discharge status: Home / Self Care | Payer: Self-pay | Attending: Emergency Medicine | Admitting: Emergency Medicine

## 2019-11-09 ENCOUNTER — Other Ambulatory Visit: Payer: Self-pay

## 2019-11-09 DIAGNOSIS — F17210 Nicotine dependence, cigarettes, uncomplicated: Secondary | ICD-10-CM | POA: Insufficient documentation

## 2019-11-09 DIAGNOSIS — L02412 Cutaneous abscess of left axilla: Secondary | ICD-10-CM | POA: Insufficient documentation

## 2019-11-09 MED ORDER — TRAMADOL HCL 50 MG PO TABS: 50.0000 mg | ORAL_TABLET | Freq: Four times a day (QID) | ORAL | 0 refills | Status: AC | PRN

## 2019-11-09 MED ORDER — SULFAMETHOXAZOLE-TRIMETHOPRIM 800-160 MG PO TABS: 1.0000 | ORAL_TABLET | Freq: Two times a day (BID) | ORAL | 0 refills | Status: AC

## 2019-11-09 NOTE — ED Triage Notes (Signed)
Patient ambulatory to triage with steady gait, without difficulty or distress noted, mask in place; pt reports painful knot to left axillae x 2 days

## 2019-11-09 NOTE — ED Notes (Signed)
See triage note  Presents with possible abscess under left arm  States he noticed area about 1 week ago  Afebrile on arrival

## 2019-11-09 NOTE — ED Provider Notes (Signed)
[facility] Emergency Department Provider Note  ____________________________________________   First MD Initiated Contact with Patient 11/09/19 [phone]     (approximate)  I have reviewed the triage vital signs and the nursing notes.   HISTORY  Chief Complaint Abscess   HPI Zachary Williamson is a 28 y.o. male presents to the ED with possible abscess to the left axilla.  Patient states that he noticed an area 1 week ago but in the last 2 days it has become more painful.  He has not used any heat or topical medications to this.  He denies any previous abscesses.  He is unaware of any fever or chills.  He rates his pain as a 9 out of 10.      Past Medical History:  Diagnosis Date  . Immune deficiency disorder (HCC)   . Lyme disease     There are no active problems to display for this patient.   History reviewed. No pertinent surgical history.  Prior to Admission medications   Medication Sig Start Date End Date Taking? Authorizing Provider  sulfamethoxazole-trimethoprim (BACTRIM DS) 800-160 MG tablet Take 1 tablet by mouth 2 (two) times daily. 11/09/19   Tommi Rumps, PA-C  traMADol (ULTRAM) 50 MG tablet Take 1 tablet (50 mg total) by mouth every 6 (six) hours as needed. 11/09/19   Tommi Rumps, PA-C    Allergies Patient has no known allergies.  No family history on file.  Social History Social History   Tobacco Use  . Smoking status: Current Every Day Smoker  . Smokeless tobacco: Never Used  Substance Use Topics  . Alcohol use: No  . Drug use: Yes    Comment: 3 weeks ago (09/12/14) sts he "doesn't anymore"    Review of Systems Constitutional: No fever/chills Cardiovascular: Denies chest pain. Respiratory: Denies shortness of breath. Musculoskeletal: Negative for back pain. Skin: Nodule left axilla. Neurological: Negative for headaches, focal weakness or numbness. ____________________________________________   PHYSICAL EXAM:   VITAL SIGNS: ED Triage Vitals  Enc Vitals Group     BP 11/09/19 0605 123/82     Pulse Rate 11/09/19 0605 (!) 58     Resp 11/09/19 0605 17     Temp 11/09/19 0605 97.7 F (36.5 C)     Temp Source 11/09/19 0605 Oral     SpO2 11/09/19 0605 100 %     Weight 11/09/19 0604 125 lb (56.7 kg)     Height 11/09/19 0604 5\' 9"  (1.753 m)     Head Circumference --      Peak Flow --      Pain Score 11/09/19 0604 9     Pain Loc --      Pain Edu? --      Excl. in GC? --    Constitutional: Alert and oriented. Well appearing and in no acute distress. Eyes: Conjunctivae are normal.  Head: Atraumatic. Neck: No stridor.   Cardiovascular: Normal rate, regular rhythm. Grossly normal heart sounds.  Good peripheral circulation. Respiratory: Normal respiratory effort.  No retractions. Lungs CTAB. Musculoskeletal: No lower extremity tenderness nor edema.  No joint effusions. Neurologic:  Normal speech and language. No gross focal neurologic deficits are appreciated. No gait instability. Skin:  Skin is warm, dry and intact. No rash noted. Psychiatric: Mood and affect are normal. Speech and behavior are normal.  ____________________________________________   LABS (all labs ordered are listed, but only abnormal results are displayed)  Labs Reviewed - No data to display  PROCEDURES  Procedure(s) performed (including Critical Care):  Procedures   ____________________________________________   INITIAL IMPRESSION / ASSESSMENT AND PLAN / ED COURSE  As part of my medical decision making, I reviewed the following data within the electronic MEDICAL RECORD NUMBER Notes from prior ED visits and [place] Controlled Substance Database  28 year old male presents today with complaint of possible abscess to his left axilla.  Patient states he noticed it a week ago.  He denies any fever, chills, nausea or vomiting and no drainage from this area.  Area is markedly tender but no erythema or surrounding cellulitis present.   Patient is encouraged to use warm moist compresses to the area frequently.  He was placed on Bactrim DS twice daily for 10 days.  He was instructed to return to the emergency department in 2 days if this area becomes soft and has still not opened up and drained on its own.  ____________________________________________   FINAL CLINICAL IMPRESSION(S) / ED DIAGNOSES  Final diagnoses:  Abscess of axilla, left     ED Discharge Orders         Ordered    sulfamethoxazole-trimethoprim (BACTRIM DS) 800-160 MG tablet  2 times daily     11/09/19 0746    traMADol (ULTRAM) 50 MG tablet  Every 6 hours PRN     11/09/19 0746           Note:  This document was prepared using Dragon voice recognition software and may include unintentional dictation errors.    Johnn Hai, PA-C 11/09/19 1518    Blake Divine, MD 11/10/19 [phone]

## 2019-11-09 NOTE — Discharge Instructions (Signed)
Call make an appointment with your primary care provider for reevaluation or return to the emergency department in 2 days for possible incision and drainage of this area.  Begin taking medication as directed.  The antibiotic is twice a day until completely finished.  Tramadol is every 6 hours as needed for pain.  Use warm compresses to the area every hour.

## 2023-04-05 DIAGNOSIS — B20 Human immunodeficiency virus [HIV] disease: Secondary | ICD-10-CM | POA: Diagnosis not present

## 2023-04-05 DIAGNOSIS — M25561 Pain in right knee: Secondary | ICD-10-CM | POA: Diagnosis not present

## 2023-04-05 DIAGNOSIS — S92251A Displaced fracture of navicular [scaphoid] of right foot, initial encounter for closed fracture: Secondary | ICD-10-CM | POA: Diagnosis not present

## 2023-04-05 DIAGNOSIS — S99921A Unspecified injury of right foot, initial encounter: Secondary | ICD-10-CM | POA: Diagnosis not present

## 2023-04-05 DIAGNOSIS — F17210 Nicotine dependence, cigarettes, uncomplicated: Secondary | ICD-10-CM | POA: Diagnosis not present

## 2023-04-05 DIAGNOSIS — Z79899 Other long term (current) drug therapy: Secondary | ICD-10-CM | POA: Diagnosis not present

## 2023-06-07 DIAGNOSIS — Z419 Encounter for procedure for purposes other than remedying health state, unspecified: Secondary | ICD-10-CM | POA: Diagnosis not present

## 2023-06-07 DIAGNOSIS — Z113 Encounter for screening for infections with a predominantly sexual mode of transmission: Secondary | ICD-10-CM | POA: Diagnosis not present

## 2023-06-07 DIAGNOSIS — Z9189 Other specified personal risk factors, not elsewhere classified: Secondary | ICD-10-CM | POA: Diagnosis not present

## 2023-06-07 DIAGNOSIS — Z21 Asymptomatic human immunodeficiency virus [HIV] infection status: Secondary | ICD-10-CM | POA: Diagnosis not present

## 2023-06-07 DIAGNOSIS — Z1159 Encounter for screening for other viral diseases: Secondary | ICD-10-CM | POA: Diagnosis not present

## 2023-06-07 DIAGNOSIS — Z79899 Other long term (current) drug therapy: Secondary | ICD-10-CM | POA: Diagnosis not present

## 2023-06-16 DIAGNOSIS — R630 Anorexia: Secondary | ICD-10-CM | POA: Diagnosis not present

## 2023-07-08 DIAGNOSIS — Z419 Encounter for procedure for purposes other than remedying health state, unspecified: Secondary | ICD-10-CM | POA: Diagnosis not present

## 2023-08-08 DIAGNOSIS — Z419 Encounter for procedure for purposes other than remedying health state, unspecified: Secondary | ICD-10-CM | POA: Diagnosis not present

## 2023-09-07 DIAGNOSIS — Z419 Encounter for procedure for purposes other than remedying health state, unspecified: Secondary | ICD-10-CM | POA: Diagnosis not present

## 2023-10-08 DIAGNOSIS — Z419 Encounter for procedure for purposes other than remedying health state, unspecified: Secondary | ICD-10-CM | POA: Diagnosis not present

## 2023-11-07 DIAGNOSIS — Z419 Encounter for procedure for purposes other than remedying health state, unspecified: Secondary | ICD-10-CM | POA: Diagnosis not present

## 2023-12-08 DIAGNOSIS — Z419 Encounter for procedure for purposes other than remedying health state, unspecified: Secondary | ICD-10-CM | POA: Diagnosis not present

## 2023-12-09 DIAGNOSIS — B20 Human immunodeficiency virus [HIV] disease: Secondary | ICD-10-CM | POA: Diagnosis not present

## 2023-12-09 DIAGNOSIS — Z113 Encounter for screening for infections with a predominantly sexual mode of transmission: Secondary | ICD-10-CM | POA: Diagnosis not present

## 2023-12-09 DIAGNOSIS — Z79899 Other long term (current) drug therapy: Secondary | ICD-10-CM | POA: Diagnosis not present

## 2023-12-09 DIAGNOSIS — Z8619 Personal history of other infectious and parasitic diseases: Secondary | ICD-10-CM | POA: Diagnosis not present

## 2023-12-09 DIAGNOSIS — Z21 Asymptomatic human immunodeficiency virus [HIV] infection status: Secondary | ICD-10-CM | POA: Diagnosis not present

## 2023-12-09 DIAGNOSIS — R21 Rash and other nonspecific skin eruption: Secondary | ICD-10-CM | POA: Diagnosis not present

## 2024-01-08 DIAGNOSIS — Z419 Encounter for procedure for purposes other than remedying health state, unspecified: Secondary | ICD-10-CM | POA: Diagnosis not present

## 2024-02-05 DIAGNOSIS — Z419 Encounter for procedure for purposes other than remedying health state, unspecified: Secondary | ICD-10-CM | POA: Diagnosis not present

## 2024-03-06 DIAGNOSIS — Z111 Encounter for screening for respiratory tuberculosis: Secondary | ICD-10-CM | POA: Diagnosis not present

## 2024-03-18 DIAGNOSIS — Z419 Encounter for procedure for purposes other than remedying health state, unspecified: Secondary | ICD-10-CM | POA: Diagnosis not present

## 2024-04-17 DIAGNOSIS — Z419 Encounter for procedure for purposes other than remedying health state, unspecified: Secondary | ICD-10-CM | POA: Diagnosis not present

## 2024-05-16 ENCOUNTER — Emergency Department
Admission: EM | Admit: 2024-05-16 | Discharge: 2024-05-16 | Disposition: A | Discharge status: Home / Self Care | Attending: Emergency Medicine | Admitting: Emergency Medicine

## 2024-05-16 ENCOUNTER — Emergency Department

## 2024-05-16 ENCOUNTER — Other Ambulatory Visit: Payer: Self-pay

## 2024-05-16 DIAGNOSIS — J069 Acute upper respiratory infection, unspecified: Secondary | ICD-10-CM | POA: Insufficient documentation

## 2024-05-16 DIAGNOSIS — R079 Chest pain, unspecified: Secondary | ICD-10-CM | POA: Diagnosis not present

## 2024-05-16 DIAGNOSIS — R0789 Other chest pain: Secondary | ICD-10-CM | POA: Diagnosis not present

## 2024-05-16 LAB — CBC
HCT: 48.6 % (ref 39.0–52.0)
Hemoglobin: 16.3 g/dL (ref 13.0–17.0)
MCH: 31.8 pg (ref 26.0–34.0)
MCHC: 33.5 g/dL (ref 30.0–36.0)
MCV: 94.9 fL (ref 80.0–100.0)
Platelets: 187 10*3/uL (ref 150–400)
RBC: 5.12 MIL/uL (ref 4.22–5.81)
RDW: 14.3 % (ref 11.5–15.5)
WBC: 6.5 10*3/uL (ref 4.0–10.5)
nRBC: 0 % (ref 0.0–0.2)

## 2024-05-16 LAB — BASIC METABOLIC PANEL WITH GFR
Anion gap: 9 (ref 5–15)
BUN: 19 mg/dL (ref 6–20)
CO2: 27 mmol/L (ref 22–32)
Calcium: 9.3 mg/dL (ref 8.9–10.3)
Chloride: 103 mmol/L (ref 98–111)
Creatinine, Ser: 1.41 mg/dL — ABNORMAL HIGH (ref 0.61–1.24)
GFR, Estimated: >60 mL/min (ref >60)
Glucose, Bld: 107 mg/dL — ABNORMAL HIGH (ref 70–99)
Potassium: 4 mmol/L (ref 3.5–5.1)
Sodium: 139 mmol/L (ref 135–145)

## 2024-05-16 LAB — TROPONIN I (HIGH SENSITIVITY): Troponin I (High Sensitivity): 4 ng/L (ref <18)

## 2024-05-16 NOTE — Discharge Instructions (Addendum)
 Please call the number provided for dermatology to arrange a follow-up appointment soon as possible.  Return to the emergency department for any symptom personally concerning to yourself.

## 2024-05-16 NOTE — ED Triage Notes (Signed)
 Pt here with cp that started this morning. Pt states pain is left sided and does not radiate. Pt states he believes its related to the tick bite on his back from 2 years ago that is now burning as well. Pt denies nausea, vomiting, or fever. Pt states the pain is sharp and has been constant since this morning.

## 2024-05-16 NOTE — ED Provider Notes (Signed)
 [facility] Provider Note    Event Date/Time   First MD Initiated Contact with Patient 05/16/24 [phone]     (approximate)  History   Chief Complaint: Chest Pain  HPI  Zachary Williamson is a 33 y.o. male presents to the emergency department with multiple complaints.  Patient states over the last 3 or 4 days he has had a cough with occasional sputum production.  No fever.  He also states over the last couple days he has been experiencing intermittent discomfort in his chest.  Patient also states approximately 3 to 4 years ago he was bit by a tick on his back and continues to have discomfort and burning to this area.  Patient states he has been tested for tickborne illnesses including Lyme disease and they have all been negative per patient.  Physical Exam   Triage Vital Signs: ED Triage Vitals [05/16/24 0906]  Encounter Vitals Group     BP 123/83     Systolic BP Percentile      Diastolic BP Percentile      Pulse Rate 71     Resp 16     Temp 97.8 F (36.6 C)     Temp Source Oral     SpO2 100 %     Weight 125 lb (56.7 kg)     Height 5\' 9"  (1.753 m)     Head Circumference      Peak Flow      Pain Score 7     Pain Loc      Pain Education      Exclude from Growth Chart     Most recent vital signs: Vitals:   05/16/24 0906  BP: 123/83  Pulse: 71  Resp: 16  Temp: 97.8 F (36.6 C)  SpO2: 100%    General: Awake, no distress.  CV:  Good peripheral perfusion.  Regular rate and rhythm  Resp:  Normal effort.  Equal breath sounds bilaterally.  Abd:  No distention.  Soft, nontender.  No rebound or guarding. Other:  Patient has an area of dry skin to the back where he states the tick bite was.  It is somewhat excoriated.   ED Results / Procedures / Treatments   EKG  EKG viewed and interpreted by myself shows a normal sinus rhythm at 65 bpm with a narrow QRS, normal axis, normal intervals, no concerning ST changes.  RADIOLOGY  I have reviewed  interpret the chest x-ray images.  No consolidation on my evaluation.   MEDICATIONS ORDERED IN ED: Medications - No data to display   IMPRESSION / MDM / ASSESSMENT AND PLAN / ED COURSE  I reviewed the triage vital signs and the nursing notes.  Patient's presentation is most consistent with acute presentation with potential threat to life or bodily function.  Patient presents to the emergency department for chest pain/discomfort intermittent over the last few days.  Patient also states 3 to 4 days of cough with occasional sputum production.  Given the symptoms we will check labs including a CBC chemistry and troponin.  Will obtain a chest x-ray.  EKG does not appear to show any significant findings.  Regarding the patient's "tick bite" area to his back it remains dry/scarred with excoriation.  Is not clear if the patient is actively scratching this area although he denies this.  At this point per patient it has been nearly 3 to 4 years since the tick bite.  I discussed with the patient that  we definitely should not see any continued issue from the tick bite at this time however given the continued skin finding I believe he needs to see a dermatologist for further evaluation and possible biopsy of this area.  Patient is agreeable to this plan we will refer to [facility] dermatology.  Lab work and chest x-ray are pending.  Patient's workup is reassuring normal CBC and troponin.  Chemistry overall reassuring with slight renal insufficiency.  Patient reassured by today's workup.  Chest x-ray is clear.  Will discharge home with outpatient follow-up as well as dermatology follow-up.  FINAL CLINICAL IMPRESSION(S) / ED DIAGNOSES   Chest pain URI   Note:  This document was prepared using Dragon voice recognition software and may include unintentional dictation errors.   Ruth Cove, MD 05/16/24 1044

## 2024-05-18 DIAGNOSIS — Z419 Encounter for procedure for purposes other than remedying health state, unspecified: Secondary | ICD-10-CM | POA: Diagnosis not present

## 2024-05-29 DIAGNOSIS — Z560 Unemployment, unspecified: Secondary | ICD-10-CM | POA: Diagnosis not present

## 2024-05-29 DIAGNOSIS — Z9189 Other specified personal risk factors, not elsewhere classified: Secondary | ICD-10-CM | POA: Diagnosis not present

## 2024-05-29 DIAGNOSIS — Z1159 Encounter for screening for other viral diseases: Secondary | ICD-10-CM | POA: Diagnosis not present

## 2024-05-29 DIAGNOSIS — Z5181 Encounter for therapeutic drug level monitoring: Secondary | ICD-10-CM | POA: Diagnosis not present

## 2024-05-29 DIAGNOSIS — Z113 Encounter for screening for infections with a predominantly sexual mode of transmission: Secondary | ICD-10-CM | POA: Diagnosis not present

## 2024-05-29 DIAGNOSIS — Z79899 Other long term (current) drug therapy: Secondary | ICD-10-CM | POA: Diagnosis not present

## 2024-05-29 DIAGNOSIS — Z8619 Personal history of other infectious and parasitic diseases: Secondary | ICD-10-CM | POA: Diagnosis not present

## 2024-05-29 DIAGNOSIS — Z5941 Food insecurity: Secondary | ICD-10-CM | POA: Diagnosis not present

## 2024-05-29 DIAGNOSIS — B20 Human immunodeficiency virus [HIV] disease: Secondary | ICD-10-CM | POA: Diagnosis not present

## 2024-06-17 DIAGNOSIS — Z419 Encounter for procedure for purposes other than remedying health state, unspecified: Secondary | ICD-10-CM | POA: Diagnosis not present

## 2024-07-18 DIAGNOSIS — Z419 Encounter for procedure for purposes other than remedying health state, unspecified: Secondary | ICD-10-CM | POA: Diagnosis not present

## 2024-08-18 DIAGNOSIS — Z419 Encounter for procedure for purposes other than remedying health state, unspecified: Secondary | ICD-10-CM | POA: Diagnosis not present

## 2024-10-18 DIAGNOSIS — Z419 Encounter for procedure for purposes other than remedying health state, unspecified: Secondary | ICD-10-CM | POA: Diagnosis not present
